# Patient Record
Sex: Female | Born: 1955
Health system: Southern US, Community
[De-identification: ages and names within clinical notes are randomized; demographics above are authoritative.]

## PROBLEM LIST (undated history)

## (undated) DIAGNOSIS — Z9289 Personal history of other medical treatment: Secondary | ICD-10-CM

## (undated) DIAGNOSIS — I499 Cardiac arrhythmia, unspecified: Secondary | ICD-10-CM

## (undated) DIAGNOSIS — E785 Hyperlipidemia, unspecified: Secondary | ICD-10-CM

## (undated) DIAGNOSIS — Z1159 Encounter for screening for other viral diseases: Secondary | ICD-10-CM

## (undated) DIAGNOSIS — F329 Major depressive disorder, single episode, unspecified: Secondary | ICD-10-CM

## (undated) DIAGNOSIS — R42 Dizziness and giddiness: Secondary | ICD-10-CM

## (undated) DIAGNOSIS — R7303 Prediabetes: Secondary | ICD-10-CM

## (undated) DIAGNOSIS — K760 Fatty (change of) liver, not elsewhere classified: Secondary | ICD-10-CM

## (undated) DIAGNOSIS — R011 Cardiac murmur, unspecified: Secondary | ICD-10-CM

## (undated) DIAGNOSIS — J45909 Unspecified asthma, uncomplicated: Secondary | ICD-10-CM

## (undated) DIAGNOSIS — Z9889 Other specified postprocedural states: Secondary | ICD-10-CM

## (undated) DIAGNOSIS — K648 Other hemorrhoids: Secondary | ICD-10-CM

## (undated) DIAGNOSIS — K219 Gastro-esophageal reflux disease without esophagitis: Secondary | ICD-10-CM

## (undated) DIAGNOSIS — B009 Herpesviral infection, unspecified: Secondary | ICD-10-CM

## (undated) DIAGNOSIS — F32A Depression, unspecified: Secondary | ICD-10-CM

## (undated) DIAGNOSIS — E041 Nontoxic single thyroid nodule: Secondary | ICD-10-CM

## (undated) DIAGNOSIS — M1990 Unspecified osteoarthritis, unspecified site: Secondary | ICD-10-CM

## (undated) DIAGNOSIS — K449 Diaphragmatic hernia without obstruction or gangrene: Secondary | ICD-10-CM

## (undated) DIAGNOSIS — R519 Headache, unspecified: Secondary | ICD-10-CM

## (undated) DIAGNOSIS — E559 Vitamin D deficiency, unspecified: Secondary | ICD-10-CM

## (undated) DIAGNOSIS — E876 Hypokalemia: Secondary | ICD-10-CM

## (undated) DIAGNOSIS — I10 Essential (primary) hypertension: Secondary | ICD-10-CM

## (undated) DIAGNOSIS — R51 Headache: Secondary | ICD-10-CM

## (undated) DIAGNOSIS — Z5189 Encounter for other specified aftercare: Secondary | ICD-10-CM

## (undated) DIAGNOSIS — B019 Varicella without complication: Secondary | ICD-10-CM

## (undated) DIAGNOSIS — R112 Nausea with vomiting, unspecified: Secondary | ICD-10-CM

## (undated) DIAGNOSIS — L309 Dermatitis, unspecified: Secondary | ICD-10-CM

## (undated) DIAGNOSIS — T7840XA Allergy, unspecified, initial encounter: Secondary | ICD-10-CM

## (undated) DIAGNOSIS — K5790 Diverticulosis of intestine, part unspecified, without perforation or abscess without bleeding: Secondary | ICD-10-CM

## (undated) DIAGNOSIS — I493 Ventricular premature depolarization: Secondary | ICD-10-CM

## (undated) DIAGNOSIS — C3490 Malignant neoplasm of unspecified part of unspecified bronchus or lung: Secondary | ICD-10-CM

## (undated) DIAGNOSIS — F419 Anxiety disorder, unspecified: Secondary | ICD-10-CM

## (undated) DIAGNOSIS — N809 Endometriosis, unspecified: Secondary | ICD-10-CM

## (undated) HISTORY — DX: Cardiac murmur, unspecified: R01.1

## (undated) HISTORY — PX: TUBAL LIGATION: SHX77

## (undated) HISTORY — DX: Varicella without complication: B01.9

## (undated) HISTORY — DX: Herpesviral infection, unspecified: B00.9

## (undated) HISTORY — PX: RECTAL SURGERY: SHX760

## (undated) HISTORY — DX: Other specified postprocedural states: Z98.890

## (undated) HISTORY — DX: Encounter for screening for other viral diseases: Z11.59

## (undated) HISTORY — DX: Hyperlipidemia, unspecified: E78.5

## (undated) HISTORY — DX: Major depressive disorder, single episode, unspecified: F32.9

## (undated) HISTORY — DX: Unspecified osteoarthritis, unspecified site: M19.90

## (undated) HISTORY — DX: Prediabetes: R73.03

## (undated) HISTORY — DX: Endometriosis, unspecified: N80.9

## (undated) HISTORY — DX: Depression, unspecified: F32.A

## (undated) HISTORY — DX: Dizziness and giddiness: R42

## (undated) HISTORY — DX: Other hemorrhoids: K64.8

## (undated) HISTORY — DX: Allergy, unspecified, initial encounter: T78.40XA

## (undated) HISTORY — PX: BREAST SURGERY: SHX581

## (undated) HISTORY — PX: LASIK: SHX215

## (undated) HISTORY — DX: Hypokalemia: E87.6

## (undated) HISTORY — DX: Fatty (change of) liver, not elsewhere classified: K76.0

## (undated) HISTORY — DX: Diaphragmatic hernia without obstruction or gangrene: K44.9

## (undated) HISTORY — DX: Cardiac arrhythmia, unspecified: I49.9

## (undated) HISTORY — DX: Diverticulosis of intestine, part unspecified, without perforation or abscess without bleeding: K57.90

## (undated) HISTORY — DX: Anxiety disorder, unspecified: F41.9

## (undated) HISTORY — PX: EXPLORATORY LAPAROTOMY: SUR591

## (undated) HISTORY — DX: Personal history of other medical treatment: Z92.89

## (undated) HISTORY — DX: Vitamin D deficiency, unspecified: E55.9

## (undated) HISTORY — DX: Nontoxic single thyroid nodule: E04.1

## (undated) HISTORY — PX: TONSILLECTOMY AND ADENOIDECTOMY: SUR1326

## (undated) HISTORY — DX: Ventricular premature depolarization: I49.3

## (undated) HISTORY — DX: Dermatitis, unspecified: L30.9

## (undated) HISTORY — DX: Encounter for other specified aftercare: Z51.89

## (undated) HISTORY — DX: Gastro-esophageal reflux disease without esophagitis: K21.9

## (undated) HISTORY — DX: Unspecified asthma, uncomplicated: J45.909

---

## 2008-06-09 DIAGNOSIS — K5790 Diverticulosis of intestine, part unspecified, without perforation or abscess without bleeding: Secondary | ICD-10-CM

## 2008-06-09 DIAGNOSIS — K648 Other hemorrhoids: Secondary | ICD-10-CM

## 2008-06-09 DIAGNOSIS — K449 Diaphragmatic hernia without obstruction or gangrene: Secondary | ICD-10-CM

## 2008-06-09 HISTORY — PX: COLONOSCOPY: SHX174

## 2008-06-09 HISTORY — DX: Diverticulosis of intestine, part unspecified, without perforation or abscess without bleeding: K57.90

## 2008-06-09 HISTORY — DX: Diaphragmatic hernia without obstruction or gangrene: K44.9

## 2008-06-09 HISTORY — PX: ESOPHAGOGASTRODUODENOSCOPY: SHX1529

## 2008-06-09 HISTORY — DX: Other hemorrhoids: K64.8

## 2012-06-09 DIAGNOSIS — Z9889 Other specified postprocedural states: Secondary | ICD-10-CM

## 2012-06-09 DIAGNOSIS — I493 Ventricular premature depolarization: Secondary | ICD-10-CM

## 2012-06-09 HISTORY — DX: Ventricular premature depolarization: I49.3

## 2012-06-09 HISTORY — DX: Other specified postprocedural states: Z98.890

## 2013-01-11 ENCOUNTER — Other Ambulatory Visit (HOSPITAL_COMMUNITY)
Admission: RE | Admit: 2013-01-11 | Discharge: 2013-01-11 | Disposition: A | Discharge status: Home / Self Care | Payer: BC Managed Care – PPO | Source: Ambulatory Visit | Attending: Interventional Radiology | Admitting: Interventional Radiology

## 2013-01-11 ENCOUNTER — Ambulatory Visit
Admission: RE | Admit: 2013-01-11 | Discharge: 2013-01-11 | Disposition: A | Discharge status: Home / Self Care | Payer: BC Managed Care – PPO | Source: Ambulatory Visit | Attending: Endocrinology | Admitting: Endocrinology

## 2013-01-11 ENCOUNTER — Other Ambulatory Visit: Payer: Self-pay | Admitting: Endocrinology

## 2013-01-11 DIAGNOSIS — E041 Nontoxic single thyroid nodule: Secondary | ICD-10-CM

## 2013-01-11 HISTORY — PX: OTHER SURGICAL HISTORY: SHX169

## 2013-01-12 ENCOUNTER — Other Ambulatory Visit: Payer: Self-pay

## 2013-04-09 DIAGNOSIS — Z9289 Personal history of other medical treatment: Secondary | ICD-10-CM

## 2013-04-09 HISTORY — DX: Personal history of other medical treatment: Z92.89

## 2014-06-09 DIAGNOSIS — Z9289 Personal history of other medical treatment: Secondary | ICD-10-CM

## 2014-06-09 HISTORY — DX: Personal history of other medical treatment: Z92.89

## 2014-07-10 HISTORY — PX: RECTOCELE REPAIR: SHX761

## 2015-08-17 ENCOUNTER — Other Ambulatory Visit: Payer: Self-pay | Admitting: Family Medicine

## 2015-08-17 DIAGNOSIS — E041 Nontoxic single thyroid nodule: Secondary | ICD-10-CM

## 2015-08-21 ENCOUNTER — Ambulatory Visit
Admission: RE | Admit: 2015-08-21 | Discharge: 2015-08-21 | Disposition: A | Discharge status: Home / Self Care | Payer: BLUE CROSS/BLUE SHIELD | Source: Ambulatory Visit | Attending: Family Medicine | Admitting: Family Medicine

## 2015-08-21 DIAGNOSIS — E041 Nontoxic single thyroid nodule: Secondary | ICD-10-CM

## 2015-10-08 DIAGNOSIS — Z136 Encounter for screening for cardiovascular disorders: Secondary | ICD-10-CM | POA: Diagnosis not present

## 2015-11-08 DIAGNOSIS — Z1231 Encounter for screening mammogram for malignant neoplasm of breast: Secondary | ICD-10-CM | POA: Diagnosis not present

## 2015-11-15 DIAGNOSIS — I10 Essential (primary) hypertension: Secondary | ICD-10-CM | POA: Diagnosis not present

## 2015-11-15 DIAGNOSIS — M179 Osteoarthritis of knee, unspecified: Secondary | ICD-10-CM | POA: Diagnosis not present

## 2015-11-22 ENCOUNTER — Ambulatory Visit (INDEPENDENT_AMBULATORY_CARE_PROVIDER_SITE_OTHER): Payer: BLUE CROSS/BLUE SHIELD | Admitting: Family Medicine

## 2015-11-22 ENCOUNTER — Encounter: Payer: Self-pay | Admitting: Family Medicine

## 2015-11-22 VITALS — BP 123/76 | HR 50 | Temp 97.9°F | Resp 20 | Ht 63.0 in | Wt 169.8 lb

## 2015-11-22 DIAGNOSIS — I2510 Atherosclerotic heart disease of native coronary artery without angina pectoris: Secondary | ICD-10-CM | POA: Insufficient documentation

## 2015-11-22 DIAGNOSIS — R918 Other nonspecific abnormal finding of lung field: Secondary | ICD-10-CM | POA: Insufficient documentation

## 2015-11-22 DIAGNOSIS — I10 Essential (primary) hypertension: Secondary | ICD-10-CM | POA: Insufficient documentation

## 2015-11-22 DIAGNOSIS — Z7689 Persons encountering health services in other specified circumstances: Secondary | ICD-10-CM

## 2015-11-22 DIAGNOSIS — K760 Fatty (change of) liver, not elsewhere classified: Secondary | ICD-10-CM | POA: Insufficient documentation

## 2015-11-22 DIAGNOSIS — E041 Nontoxic single thyroid nodule: Secondary | ICD-10-CM | POA: Insufficient documentation

## 2015-11-22 DIAGNOSIS — Z6830 Body mass index (BMI) 30.0-30.9, adult: Secondary | ICD-10-CM | POA: Diagnosis not present

## 2015-11-22 DIAGNOSIS — F418 Other specified anxiety disorders: Secondary | ICD-10-CM | POA: Insufficient documentation

## 2015-11-22 DIAGNOSIS — Z7189 Other specified counseling: Secondary | ICD-10-CM

## 2015-11-22 DIAGNOSIS — M47814 Spondylosis without myelopathy or radiculopathy, thoracic region: Secondary | ICD-10-CM | POA: Insufficient documentation

## 2015-11-22 NOTE — Patient Instructions (Signed)
Regina Black, cardiologist, any images/labs.  appt 3-4 weeks for CPE after all records received.   It was a pleasure meeting you today! I look forward to taking care of you .

## 2015-11-22 NOTE — Progress Notes (Signed)
Patient ID: Regina Black, female   DOB: 01/02/1956, 60 y.o.   MRN: [id number]      Patient ID: Regina Black, female  DOB: 10-Aug-1955, 60 y.o.   MRN: [id number]  Subjective:  Regina Black is a 60 y.o.  female present for establishment of care.  All past medical history, surgical history, allergies, family history, immunizations, medications and social history were obtained and entered  in the electronic medical record today. All recent labs, ED visits and hospitalizations within the last year were reviewed. Pt did not bring records, as requested, care everywhere was used to attempt to find records for patient.  Prior PCP: Dr. Gwenyth Allegra, [facility]. ([facility]) GYN: Dr. Kris Mouton. Per his note 08/21/2015: HTN, thyroid disease, hyperlipidemia, prediabetes.  Patient presents today for establishment of care with multiple things she states needs followed up on that were abnormal (Labs/imagining). No records are available for any these concerns and she did not bring records with her.   She states she has a heart murmur and arrhythmia. She also has hypertension that had been difficult to control. She is prescribed HCTZ and lisinopril and metoprolol. She has not been seen by her "PCP" or cardiologist in over 2 years. She reports having completed a stress test and echocardiogram, no records available and she does not recall the cardiologists name. 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   She also states she had elevated cholesterol, liver enzymes and glucose on her labs through work, again no records available. She is not on mediations for any of the above. She reports the PA completed "hepatitis C" labs, to work up LFT abnormality and it was normal.   Patient reports history of a lung nodule on CT last year. She is a former smoker. [place] Patient states the CT report states she needs a follow up CT in 6-12 months and that is overdue.   Pt reports she has a thyroid nodule. She thinks it is getting bigger, but according to her, her last scan states it is smaller. She reports she use to have an endocrinologist a long time ago.   Depression/anxiety: pt reports she has taken Cymbalta 30 mg daily for over 10 years and is doing well, no concerns.   Health maintenance:  Colonoscopy: pt reports it was completed about 5 years ago with an EGD. She does not recall where. She states it was normal.  Mammogram: Dr. Kris Mouton [facility] (2017) Cervical cancer screening: Dr. Kris Mouton [facility] (2015) Immunizations: UTD flu shot 2016 through employment. Unknown other vaccinations.  Infectious disease screening: Unknown. Possibly had hep c screen through work.  DEXA: unknown  Past Medical History  Diagnosis Date  . Allergy   . Anxiety   . Asthma   . Blood transfusion without reported diagnosis   . Chicken pox   . Depression   . GERD (gastroesophageal reflux disease)   . Heart murmur    Allergies  Allergen Reactions  . Butorphanol Other (See Comments)    syncope  . Codeine Nausea And Vomiting   Past Surgical History  Procedure Laterality Date  . Breast surgery      augmentation  . Rectal surgery    . Tonsillectomy and adenoidectomy    . Rectocele repair  07/2014   Family History  Problem Relation Age of Onset  . Lung cancer Mother   .  Heart disease Mother   . Alcohol abuse Father   . Heart disease Father   . Breast cancer Paternal Aunt 2  . Heart disease Maternal Grandmother   . Arthritis Maternal Grandmother   . Arthritis Maternal Grandfather   . Heart disease Maternal Grandfather   . Hearing loss Maternal Grandfather   . Stroke Maternal Grandfather   . Diabetes Paternal Grandmother   . Hearing loss Paternal Grandfather    Social History   Social History  . Marital Status: Married    Spouse Name: N/A  . Number of  Children: N/A  . Years of Education: N/A   Occupational History  . Not on file.   Social History Main Topics  . Smoking status: Former Research scientist (life sciences)  . Smokeless tobacco: Never Used  . Alcohol Use: 1.2 oz/week    2 Glasses of wine, 0 Standard drinks or equivalent per week  . Drug Use: No  . Sexual Activity: Yes    Birth Control/ Protection: None   Other Topics Concern  . Not on file   Social History Narrative   Married, Shanon Brow. No children.   Mini dachshund (saddie-mae)   HS diploma- Finance.    Former smoker, no etoh or drugs.   Drinks caffeine   Wears seatbelt   Exercises routinely   Smoke detector in the home.    Forearms in the home, locked.    Feels safe in her relationships.      Medication List       This list is accurate as of: 11/22/15 11:31 AM.  Always use your most recent med list.               DULoxetine 30 MG capsule  Commonly known as:  CYMBALTA  Take 30 mg by mouth.     hydrochlorothiazide 25 MG tablet  Commonly known as:  HYDRODIURIL  Take 25 mg by mouth daily.     ibuprofen 800 MG tablet  Commonly known as:  ADVIL,MOTRIN  Take 800 mg by mouth.     lisinopril 40 MG tablet  Commonly known as:  PRINIVIL,ZESTRIL  Take 40 mg by mouth daily.     metoprolol 100 MG tablet  Commonly known as:  LOPRESSOR  Take 100 mg by mouth daily.     omeprazole 20 MG capsule  Commonly known as:  PRILOSEC  Take 20 mg by mouth.     penciclovir 1 % cream  Commonly known as:  DENAVIR  Apply topically.     valACYclovir 500 MG tablet  Commonly known as:  VALTREX  Take 500 mg by mouth.        ROS: 14 pt review of systems performed and negative (unless mentioned in an HPI)  Objective: BP 123/76 mmHg  Pulse 50  Temp(Src) 97.9 F (36.6 C) (Oral)  Resp 20  Ht '5\' 3"'$  (1.6 m)  Wt 169 lb 12.8 oz (77.021 kg)  BMI 30.09 kg/m2  SpO2 98% Gen: Afebrile. No acute distress. Nontoxic in appearance, well-developed, well-nourished,caucasian  female.  HENT: AT. [place]. MMM,  no oral lesions Eyes:Pupils Equal Round Reactive to light, Extraocular movements intact,  Conjunctiva without redness, discharge or icterus. Neck/lymp/endocrine: Supple, no  lymphadenopathy, mild (felt R>L) thyromegaly. Large oval area of hypopigmentation anterior neck above cricoid.  CV: RRR, no edema, +2/4 P posterior tibialis pulses. Chest: CTAB, no wheeze, rhonchi or crackles.  Abd: Soft. NTND. BS present  Skin: Warm and well-perfused. Skin intact. Neuro/Msk: Normal gait. PERLA. EOMi. Alert. Oriented x3.  Psych: Normal affect, dress and demeanor. Normal speech. Normal thought content and judgment.  Assessment/plan: Nemiah Bubar is a 60 y.o. female present for establishment of care.  Essential hypertension, benign - confusing h/o on whom is actually managing her cardiac conditions, and what they even are.  - requested records from prior cardiologist. I want to see copies of the cardiac work up she had and actual true diagnoses prior to adding them to her problem list.  - Currently her HTN is controlled on BB, Acei and HCTZ. Will continue - Discussed with pt if she is listing me as her PCP, unless she is seeing cardiology, I will be the one managing her medical issues and all medications. She can continue to ger her labs through employer yearly and have those forwarded to [place], but she will have to have CPE yearly and chronic medical issues followed here.  Depression with anxiety - continue Cymbalta, uncertain if every tried off med or others medications.  - By care everywhere it appears Dr. Kathlen Mody prescribed this medication only.   BMI 30.0-30.9,adult Diet and exercise counseled. Will provide additional resources after CPE/labs.    Return in about 4 weeks (around 12/20/2015) for CPE.  Greater than 45 minutes was spent with patient, greater than 50% of that time was spent face-to-face with patient counseling and coordinating care.  Electronically signed by: Howard Pouch, DO [place]

## 2015-12-12 DIAGNOSIS — R42 Dizziness and giddiness: Secondary | ICD-10-CM | POA: Diagnosis not present

## 2015-12-20 ENCOUNTER — Encounter: Payer: Self-pay | Admitting: Family Medicine

## 2015-12-20 ENCOUNTER — Ambulatory Visit (INDEPENDENT_AMBULATORY_CARE_PROVIDER_SITE_OTHER): Payer: BLUE CROSS/BLUE SHIELD | Admitting: Family Medicine

## 2015-12-20 VITALS — BP 140/78 | HR 50 | Temp 98.5°F | Resp 20 | Ht 63.0 in | Wt 169.8 lb

## 2015-12-20 DIAGNOSIS — Z13220 Encounter for screening for lipoid disorders: Secondary | ICD-10-CM

## 2015-12-20 DIAGNOSIS — E041 Nontoxic single thyroid nodule: Secondary | ICD-10-CM

## 2015-12-20 DIAGNOSIS — Z0000 Encounter for general adult medical examination without abnormal findings: Secondary | ICD-10-CM

## 2015-12-20 DIAGNOSIS — Z1321 Encounter for screening for nutritional disorder: Secondary | ICD-10-CM

## 2015-12-20 DIAGNOSIS — F418 Other specified anxiety disorders: Secondary | ICD-10-CM

## 2015-12-20 DIAGNOSIS — Z114 Encounter for screening for human immunodeficiency virus [HIV]: Secondary | ICD-10-CM | POA: Diagnosis not present

## 2015-12-20 DIAGNOSIS — Z6830 Body mass index (BMI) 30.0-30.9, adult: Secondary | ICD-10-CM

## 2015-12-20 DIAGNOSIS — Z131 Encounter for screening for diabetes mellitus: Secondary | ICD-10-CM | POA: Diagnosis not present

## 2015-12-20 DIAGNOSIS — Z130 Encounter for screening for diseases of the blood and blood-forming organs and certain disorders involving the immune mechanism: Secondary | ICD-10-CM | POA: Diagnosis not present

## 2015-12-20 DIAGNOSIS — I10 Essential (primary) hypertension: Secondary | ICD-10-CM

## 2015-12-20 DIAGNOSIS — R918 Other nonspecific abnormal finding of lung field: Secondary | ICD-10-CM

## 2015-12-20 DIAGNOSIS — K219 Gastro-esophageal reflux disease without esophagitis: Secondary | ICD-10-CM

## 2015-12-20 DIAGNOSIS — Z23 Encounter for immunization: Secondary | ICD-10-CM | POA: Diagnosis not present

## 2015-12-20 DIAGNOSIS — R945 Abnormal results of liver function studies: Secondary | ICD-10-CM

## 2015-12-20 DIAGNOSIS — I499 Cardiac arrhythmia, unspecified: Secondary | ICD-10-CM

## 2015-12-20 DIAGNOSIS — Z1329 Encounter for screening for other suspected endocrine disorder: Secondary | ICD-10-CM | POA: Diagnosis not present

## 2015-12-20 DIAGNOSIS — R7989 Other specified abnormal findings of blood chemistry: Secondary | ICD-10-CM

## 2015-12-20 LAB — CBC WITH DIFFERENTIAL/PLATELET
BASOS ABS: 0.2 10*3/uL — AB (ref 0.0–0.1)
Basophils Relative: 2.4 % (ref 0.0–3.0)
Eosinophils Absolute: 0.8 10*3/uL — ABNORMAL HIGH (ref 0.0–0.7)
Eosinophils Relative: 10.4 % — ABNORMAL HIGH (ref 0.0–5.0)
HEMATOCRIT: 40.3 % (ref 36.0–46.0)
Hemoglobin: 13.5 g/dL (ref 12.0–15.0)
LYMPHS ABS: 1.8 10*3/uL (ref 0.7–4.0)
Lymphocytes Relative: 24.7 % (ref 12.0–46.0)
MCHC: 33.4 g/dL (ref 30.0–36.0)
MCV: 88.8 fl (ref 78.0–100.0)
MONOS PCT: 4.6 % (ref 3.0–12.0)
Monocytes Absolute: 0.3 10*3/uL (ref 0.1–1.0)
Neutro Abs: 4.2 10*3/uL (ref 1.4–7.7)
Neutrophils Relative %: 57.9 % (ref 43.0–77.0)
PLATELETS: 339 10*3/uL (ref 150.0–400.0)
RBC: 4.55 Mil/uL (ref 3.87–5.11)
RDW: 14.3 % (ref 11.5–15.5)
WBC: 7.3 10*3/uL (ref 4.0–10.5)

## 2015-12-20 LAB — TSH: TSH: 0.43 u[IU]/mL (ref 0.35–4.50)

## 2015-12-20 LAB — VITAMIN D 25 HYDROXY (VIT D DEFICIENCY, FRACTURES): VITD: 28.06 ng/mL — AB (ref 30.00–100.00)

## 2015-12-20 LAB — HIV ANTIBODY (ROUTINE TESTING W REFLEX): HIV: NONREACTIVE

## 2015-12-20 LAB — HEMOGLOBIN A1C: Hgb A1c MFr Bld: 5.5 % (ref 4.6–6.5)

## 2015-12-20 MED ORDER — DULOXETINE HCL 60 MG PO CPEP
60.0000 mg | ORAL_CAPSULE | Freq: Every day | ORAL | Status: DC
Start: 2015-12-20 — End: 2016-03-30

## 2015-12-20 NOTE — Progress Notes (Signed)
Patient ID: Regina Black, female   DOB: 06-05-1956, 60 y.o.   MRN: [id number]     Patient ID: Regina Black, female  DOB: January 10, 1956, 60 y.o.   MRN: [id number] Patient Care Team    Relationship Specialty Notifications Start End  [name], DO PCP - General Family Medicine  11/22/15   Revonda Standard. Rohrbeck, MD  Cardiology  11/22/15   Luellen Pucker, MD  Obstetrics and Gynecology  12/10/15     Subjective:  Regina Black is a 60 y.o.  Female  present for CPE without PAP. All past medical history, surgical history, allergies, family history, immunizations, medications and social history were updated in the electronic medical record today. All recent labs, ED visits and hospitalizations within the last year were reviewed. Prior PCP: Dr. Gwenyth Allegra, [facility]. ([facility]) GYN: Dr. Kris Mouton. Per his note 08/21/2015: HTN, thyroid disease, hyperlipidemia, prediabetes.  Patient presents today for establishment of care with multiple things she states needs followed up on that were abnormal (Labs/imagining). No records are available for any these concerns and she did not bring records with her.   lung nodule: lung nodule on  CT 09/04/2014. Ground glass appearance mostly RUL largest 13 mm, several small pulmonary nodules largest RUL 57m. It was recommended by radiology f/u in 3 months. NO repeat CT scan has been completed. She is a former smoker, about a 10 pack year history.   Depression/anxiety: pt reports she has taken Cymbalta 30 mg daily for over 10 years. However she feels more stress and it is not controlled. 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 Her husband has been ill and in the hospital. She is the "go to person" in her family and feels overwhelmed. They are having issues with her nephew, which she states she is like a mother to. She is tearful, and feels she may need more coverage.    Health maintenance:  Colonoscopy: pt reports it was  completed about 5 years ago with an EGD/colonoscopy. She does not recall where. She states it was normal. 10 year follow up recommended--> 2020? Repeat. Records requested. Mammogram: Dr. FKris MoutonGYN (11/09/2015). [facility]1 Cervical cancer screening: Dr. FKris MoutonGYN (05/19/2014) Immunizations: UTD flu shot 2016 through employment. tdap administered today Infectious disease screening: hep c negative 08/2015  DEXA: Completed 07/26/15, pt reports it was normal. [facility]. Records requested. Assistive device: none Oxygen use: none Patient has a Dental and Orthodontic home and routine visits.  Hospitalizations/ED visits: none  Immunization History  Administered Date(s) Administered  . Influenza-Unspecified 03/23/2015  . Tdap 12/20/2015     Past Medical History  Diagnosis Date  . Allergy   . Anxiety   . Asthma   . Blood transfusion without reported diagnosis   . Chicken pox   . Depression   . GERD (gastroesophageal reflux disease)   . Heart murmur   . Vitamin D deficiency   . Prediabetes   . Hyperlipidemia   . Thyroid nodule   . History of cardiovascular stress test 04/2013    negative per OV note from Dr. RMarijo File . History of echocardiogram 04/2013    normal study; EF 75% per Dr. RMarijo File . PVC (premature ventricular contraction) 2014    palpitations  . History of EKG 2016    NSR with PVC, Dr. RMarijo File . Hypokalemia   . History of Holter monitoring 2014    palpitations/pvc  . Fatty liver   . PCR  DNA positive for HSV1   . Osteoarthritis   . Eczema   . Vertigo   . Arrhythmia   . Prediabetes   . Endometriosis    Allergies  Allergen Reactions  . Butorphanol Other (See Comments)    syncope  . Codeine Nausea And Vomiting   Past Surgical History  Procedure Laterality Date  . Breast surgery      reduction  . Rectal surgery    . Tonsillectomy and adenoidectomy    . Rectocele repair  07/2014  . Tubal ligation    . Thyroid nodule asp  01/11/2013  . Exploratory  laparotomy      endometriosis   Family History  Problem Relation Age of Onset  . Lung cancer Mother   . Heart disease Mother   . Alcohol abuse Father   . Heart disease Father   . Breast cancer Paternal Aunt 4  . Heart disease Maternal Grandmother   . Arthritis Maternal Grandmother   . Arthritis Maternal Grandfather   . Heart disease Maternal Grandfather   . Hearing loss Maternal Grandfather   . Stroke Maternal Grandfather   . Diabetes Paternal Grandmother   . Hearing loss Paternal Grandfather    Social History   Social History  . Marital Status: Married    Spouse Name: N/A  . Number of Children: N/A  . Years of Education: N/A   Occupational History  . Not on file.   Social History Main Topics  . Smoking status: Former Research scientist (life sciences)  . Smokeless tobacco: Never Used  . Alcohol Use: 1.2 oz/week    2 Glasses of wine, 0 Standard drinks or equivalent per week  . Drug Use: No  . Sexual Activity: Yes    Birth Control/ Protection: None   Other Topics Concern  . Not on file   Social History Narrative   Married, Shanon Brow. No children.   Mini dachshund (saddie-mae)   HS diploma- Finance.    Former smoker, no etoh or drugs.   Drinks caffeine   Wears seatbelt   Exercises routinely   Smoke detector in the home.    Forearms in the home, locked.    Feels safe in her relationships.      Medication List       This list is accurate as of: 12/20/15 11:59 PM.  Always use your most recent med list.               DULoxetine 60 MG capsule  Commonly known as:  CYMBALTA  Take 1 capsule (60 mg total) by mouth daily.     hydrochlorothiazide 25 MG tablet  Commonly known as:  HYDRODIURIL  Take 25 mg by mouth daily.     ibuprofen 800 MG tablet  Commonly known as:  ADVIL,MOTRIN  Take 800 mg by mouth.     lisinopril 40 MG tablet  Commonly known as:  PRINIVIL,ZESTRIL  Take 40 mg by mouth daily.     metoprolol succinate 100 MG 24 hr tablet  Commonly known as:  TOPROL-XL  Take 100  mg by mouth daily. Take with or immediately following a meal.     omeprazole 20 MG capsule  Commonly known as:  PRILOSEC  Take 20 mg by mouth.     penciclovir 1 % cream  Commonly known as:  DENAVIR  Apply topically.     valACYclovir 500 MG tablet  Commonly known as:  VALTREX  Take 500 mg by mouth.         Recent  Results (from the past 2160 hour(s))  CBC w/Diff     Status: Abnormal   Collection Time: 12/20/15  8:33 AM  Result Value Ref Range   WBC 7.3 4.0 - 10.5 K/uL   RBC 4.55 3.87 - 5.11 Mil/uL   Hemoglobin 13.5 12.0 - 15.0 g/dL   HCT 40.3 36.0 - 46.0 %   MCV 88.8 78.0 - 100.0 fl   MCHC 33.4 30.0 - 36.0 g/dL   RDW 14.3 11.5 - 15.5 %   Platelets 339.0 150.0 - 400.0 K/uL   Neutrophils Relative % 57.9 43.0 - 77.0 %   Lymphocytes Relative 24.7 12.0 - 46.0 %   Monocytes Relative 4.6 3.0 - 12.0 %   Eosinophils Relative 10.4 (H) 0.0 - 5.0 %   Basophils Relative 2.4 0.0 - 3.0 %   Neutro Abs 4.2 1.4 - 7.7 K/uL   Lymphs Abs 1.8 0.7 - 4.0 K/uL   Monocytes Absolute 0.3 0.1 - 1.0 K/uL   Eosinophils Absolute 0.8 (H) 0.0 - 0.7 K/uL   Basophils Absolute 0.2 (H) 0.0 - 0.1 K/uL  Comp Met (CMET)     Status: Abnormal   Collection Time: 12/20/15  8:33 AM  Result Value Ref Range   Sodium 142 135 - 145 mEq/L   Potassium 4.5 3.5 - 5.1 mEq/L   Chloride 104 96 - 112 mEq/L   CO2 27 19 - 32 mEq/L   Glucose, Bld 108 (H) 70 - 99 mg/dL   BUN 20 6 - 23 mg/dL   Creatinine, Ser 0.87 0.40 - 1.20 mg/dL   Total Bilirubin 0.3 0.2 - 1.2 mg/dL   Alkaline Phosphatase 137 (H) 39 - 117 U/L   AST 26 0 - 37 U/L   ALT 33 0 - 35 U/L   Total Protein 7.2 6.0 - 8.3 g/dL   Albumin 4.0 3.5 - 5.2 g/dL   Calcium 9.8 8.4 - 10.5 mg/dL   GFR 71.08 >60.00 mL/min  TSH     Status: None   Collection Time: 12/20/15  8:33 AM  Result Value Ref Range   TSH 0.43 0.35 - 4.50 uIU/mL  Vitamin D (25 hydroxy)     Status: Abnormal   Collection Time: 12/20/15  8:33 AM  Result Value Ref Range   VITD 28.06 (L) 30.00 - 100.00  ng/mL  HgB A1c     Status: None   Collection Time: 12/20/15  8:33 AM  Result Value Ref Range   Hgb A1c MFr Bld 5.5 4.6 - 6.5 %    Comment: Glycemic Control Guidelines for People with Diabetes:Non Diabetic:  <6%Goal of Therapy: <7%Additional Action Suggested:  >8%   HIV antibody (with reflex)     Status: None   Collection Time: 12/20/15  8:48 AM  Result Value Ref Range   HIV 1&2 Ab, 4th Generation NONREACTIVE NONREACTIVE    Comment:   HIV-1 antigen and HIV-1/HIV-2 antibodies were not detected.  There is no laboratory evidence of HIV infection.   HIV-1/2 Antibody Diff        Not indicated. HIV-1 RNA, Qual TMA          Not indicated.     PLEASE NOTE: This information has been disclosed to you from records whose confidentiality may be protected by state law. If your state requires such protection, then the state law prohibits you from making any further disclosure of the information without the specific written consent of the person to whom it pertains, or as otherwise permitted by law. A general  authorization for the release of medical or other information is NOT sufficient for this purpose.   The performance of this assay has not been clinically validated in patients less than 87 years old.   For additional information please refer to http://education.questdiagnostics.com/faq/FAQ106.  (This link is being provided for informational/educational purposes only.)       US Thyroid  08/21/2015  CLINICAL DATA:  60 year old female with a history of left thyroid nodule, previously biopsied. EXAM: THYROID ULTRASOUND TECHNIQUE: Ultrasound examination of the thyroid gland and adjacent soft tissues was performed. COMPARISON:  03/01/2014, 01/11/2013 FINDINGS: Right thyroid lobe Measurements: 4.1 cm x 1.3 cm x 1.3 cm. Relatively homogeneous appearance of the right thyroid. Left thyroid lobe Measurements: 4.2 cm x 1.5 cm x 2.3 cm. Homogeneous appearance of the left thyroid. Similar appearance of  solid nodule with coarsened internal calcification. Nodule measures 3.2 cm x 1.4 cm x 1.8 cm. This again measures smaller than the study dated 03/01/2014. Isthmus Thickness: 2 mm.  No nodules visualized. Lymphadenopathy None visualized. IMPRESSION: Continued decreased size of previously biopsied left-sided thyroid nodule, with no new nodules. Signed, Dulcy Fanny. Earleen Newport, DO [facility] Electronically Signed   By: Corrie Mckusick D.O.   On: 08/21/2015 17:23     ROS: 14 pt review of systems performed and negative (unless mentioned in an HPI)  Objective: BP 140/78 mmHg  Pulse 50  Temp(Src) 98.5 F (36.9 C)  Resp 20  Ht 5' 3"  (1.6 m)  Wt 169 lb 12.8 oz (77.021 kg)  BMI 30.09 kg/m2  SpO2 98% Gen: Afebrile. No acute distress. Nontoxic in appearance, well-developed, well-nourished,  Very pleasant caucasian female.  HENT: AT. [place]. Bilateral TM visualized and normal in appearance, normal external auditory canal. MMM, no oral lesions, adequate dentition. Bilateral nares within normal limits. Throat without erythema, ulcerations or exudates. no Cough on exam, no hoarseness on exam. Eyes:Pupils Equal Round Reactive to light, Extraocular movements intact,  Conjunctiva without redness, discharge or icterus. Neck/lymp/endocrine: Supple,no lymphadenopathy, no thyromegaly CV: RRR 1/6 SM, no edema, +2/4 P posterior tibialis pulses. no carotid bruits. No JVD. Chest: CTAB, no wheeze, rhonchi or crackles. normal Respiratory effort. good Air movement. Abd: Soft. round. NTND. BS present. no Masses palpated.. No rebound tenderness or guarding. Liver edge palpable.  Skin: no rashes, purpura or petechiae. Warm and well-perfused. Skin intact. Neuro/Msk:  Normal gait. PERLA. EOMi. Alert. Oriented x3.  Cranial nerves II through XII intact. Muscle strength 5/5 upper/lower extremity. DTRs equal bilaterally. Psych: tearful. Sad.  Normal dress. Normal speech. Normal  thought content and judgment.  Assessment/plan: Regina Black is a 60 y.o. female present for CPE without PAP. Encounter for preventive health examination/BMI 30.0-30.9,adult: Patient was encouraged to exercise greater than 150 minutes a week. Patient was encouraged to choose a diet filled with fresh fruits and vegetables, and lean meats. AVS provided to patient today for education/recommendation on gender specific health and safety maintenance. - CBC w/Diff - TSH - Vitamin D (25 hydroxy) - HIV antibody (with reflex) - HgB A1c--> pt was a prediabetic in 2015 and placed on metformin.   Need for diphtheria-tetanus-pertussis (Tdap) vaccine, adult/adolescent - Tdap vaccine greater than or equal to 7yo IM  Gastroesophageal reflux disease, esophagitis presence not specified - continue Protonix, will eventually want to try and taper off medicine  Multiple pulmonary nodules - report scanned from [facility]. Specifics above in note for chest. - Elevated lft with elevated alk phos/GTT. No complete liver of imaging completed.  -  Repeat CT overdue.  - CT Chest will be ordered.  Thyroid nodule - appears to be shrinking, repeat US 09/2016 indictaed  Essential hypertension, benign/irregular heart beat Stable.  Continue current regimen for now.  - Consider lowering to 75 mg daily. HR 50 - uncertain arrhythmia history. Prior PCP Records are not revealing as type, metoprolol was prescribed and pt feels HR controlled. No palpitations. PVC documented in prior PCP records. - She states she will be due in October for follow up cardiology appt. No records from a cardiologist has been received and she is requesting new cardiology. - will need to see pt back and perform EKG and consider lower metoprolol to 75 mg. I would like to wait for additional records before referral, since I have no specifics on her condition.   Depression with anxiety - increase Cymbalta to 60 mg QD today - pt to follow up 4 weeks.    Elevated LFTs - Alk phos elevated, GTT elevated in the past, elevated ALT history for about 2 years intermittently from old records.  - Hepatitis B,C have been negative. - HIV completed today and negative - No imaging of the Liver has been completed that pt is aware.  - Will need to order additional labs for eval of liver and consider imaging.     Return in about 4 weeks (around 01/17/2016) for depression/anxiety.  Electronically signed by: Howard Pouch, DO [place]

## 2015-12-20 NOTE — Patient Instructions (Addendum)
Health Maintenance, Female Adopting a healthy lifestyle and getting preventive care can go a long way to promote health and wellness. Talk with your health care provider about what schedule of regular examinations is right for you. This is a good chance for you to check in with your provider about disease prevention and staying healthy. In between checkups, there are plenty of things you can do on your own. Experts have done a lot of research about which lifestyle changes and preventive measures are most likely to keep you healthy. Ask your health care provider for more information. WEIGHT AND DIET  Eat a healthy diet  Be sure to include plenty of vegetables, fruits, low-fat dairy products, and lean protein.  Do not eat a lot of foods high in solid fats, added sugars, or salt.  Get regular exercise. This is one of the most important things you can do for your health.  Most adults should exercise for at least 150 minutes each week. The exercise should increase your heart rate and make you sweat (moderate-intensity exercise).  Most adults should also do strengthening exercises at least twice a week. This is in addition to the moderate-intensity exercise.  Maintain a healthy weight  Body mass index (BMI) is a measurement that can be used to identify possible weight problems. It estimates body fat based on height and weight. Your health care provider can help determine your BMI and help you achieve or maintain a healthy weight.  For females 20 years of age and older:   A BMI below 18.5 is considered underweight.  A BMI of 18.5 to 24.9 is normal.  A BMI of 25 to 29.9 is considered overweight.  A BMI of 30 and above is considered obese.  Watch levels of cholesterol and blood lipids  You should start having your blood tested for lipids and cholesterol at 58 years of age, then have this test every 5 years.  You may need to have your cholesterol levels checked more often if:  Your lipid  or cholesterol levels are high.  You are older than 58 years of age.  You are at high risk for heart disease.  CANCER SCREENING   Lung Cancer  Lung cancer screening is recommended for adults 55-80 years old who are at high risk for lung cancer because of a history of smoking.  A yearly low-dose CT scan of the lungs is recommended for people who:  Currently smoke.  Have quit within the past 15 years.  Have at least a 30-pack-year history of smoking. A pack year is smoking an average of one pack of cigarettes a day for 1 year.  Yearly screening should continue until it has been 15 years since you quit.  Yearly screening should stop if you develop a health problem that would prevent you from having lung cancer treatment.  Breast Cancer  Practice breast self-awareness. This means understanding how your breasts normally appear and feel.  It also means doing regular breast self-exams. Let your health care provider know about any changes, no matter how small.  If you are in your 20s or 30s, you should have a clinical breast exam (CBE) by a health care provider every 1-3 years as part of a regular health exam.  If you are 40 or older, have a CBE every year. Also consider having a breast X-ray (mammogram) every year.  If you have a family history of breast cancer, talk to your health care provider about genetic screening.  If you   are at high risk for breast cancer, talk to your health care provider about having an MRI and a mammogram every year.  Breast cancer gene (BRCA) assessment is recommended for women who have family members with BRCA-related cancers. BRCA-related cancers include:  Breast.  Ovarian.  Tubal.  Peritoneal cancers.  Results of the assessment will determine the need for genetic counseling and BRCA1 and BRCA2 testing. Cervical Cancer Your health care provider may recommend that you be screened regularly for cancer of the pelvic organs (ovaries, uterus, and  vagina). This screening involves a pelvic examination, including checking for microscopic changes to the surface of your cervix (Pap test). You may be encouraged to have this screening done every 3 years, beginning at age 21.  For women ages 30-65, health care providers may recommend pelvic exams and Pap testing every 3 years, or they may recommend the Pap and pelvic exam, combined with testing for human papilloma virus (HPV), every 5 years. Some types of HPV increase your risk of cervical cancer. Testing for HPV may also be done on women of any age with unclear Pap test results.  Other health care providers may not recommend any screening for nonpregnant women who are considered low risk for pelvic cancer and who do not have symptoms. Ask your health care provider if a screening pelvic exam is right for you.  If you have had past treatment for cervical cancer or a condition that could lead to cancer, you need Pap tests and screening for cancer for at least 20 years after your treatment. If Pap tests have been discontinued, your risk factors (such as having a new sexual partner) need to be reassessed to determine if screening should resume. Some women have medical problems that increase the chance of getting cervical cancer. In these cases, your health care provider may recommend more frequent screening and Pap tests. Colorectal Cancer  This type of cancer can be detected and often prevented.  Routine colorectal cancer screening usually begins at 58 years of age and continues through 58 years of age.  Your health care provider may recommend screening at an earlier age if you have risk factors for colon cancer.  Your health care provider may also recommend using home test kits to check for hidden blood in the stool.  A small camera at the end of a tube can be used to examine your colon directly (sigmoidoscopy or colonoscopy). This is done to check for the earliest forms of colorectal  cancer.  Routine screening usually begins at age 50.  Direct examination of the colon should be repeated every 5-10 years through 58 years of age. However, you may need to be screened more often if early forms of precancerous polyps or small growths are found. Skin Cancer  Check your skin from head to toe regularly.  Tell your health care provider about any new moles or changes in moles, especially if there is a change in a mole's shape or color.  Also tell your health care provider if you have a mole that is larger than the size of a pencil eraser.  Always use sunscreen. Apply sunscreen liberally and repeatedly throughout the day.  Protect yourself by wearing long sleeves, pants, a wide-brimmed hat, and sunglasses whenever you are outside. HEART DISEASE, DIABETES, AND HIGH BLOOD PRESSURE   High blood pressure causes heart disease and increases the risk of stroke. High blood pressure is more likely to develop in:  People who have blood pressure in the high end   of the normal range (130-139/85-89 mm Hg).  People who are overweight or obese.  People who are African American.  If you are 38-23 years of age, have your blood pressure checked every 3-5 years. If you are 61 years of age or older, have your blood pressure checked every year. You should have your blood pressure measured twice--once when you are at a hospital or clinic, and once when you are not at a hospital or clinic. Record the average of the two measurements. To check your blood pressure when you are not at a hospital or clinic, you can use:  An automated blood pressure machine at a pharmacy.  A home blood pressure monitor.  If you are between 45 years and 39 years old, ask your health care provider if you should take aspirin to prevent strokes.  Have regular diabetes screenings. This involves taking a blood sample to check your fasting blood sugar level.  If you are at a normal weight and have a low risk for diabetes,  have this test once every three years after 58 years of age.  If you are overweight and have a high risk for diabetes, consider being tested at a younger age or more often. PREVENTING INFECTION  Hepatitis B  If you have a higher risk for hepatitis B, you should be screened for this virus. You are considered at high risk for hepatitis B if:  You were born in a country where hepatitis B is common. Ask your health care provider which countries are considered high risk.  Your parents were born in a high-risk country, and you have not been immunized against hepatitis B (hepatitis B vaccine).  You have HIV or AIDS.  You use needles to inject street drugs.  You live with someone who has hepatitis B.  You have had sex with someone who has hepatitis B.  You get hemodialysis treatment.  You take certain medicines for conditions, including cancer, organ transplantation, and autoimmune conditions. Hepatitis C  Blood testing is recommended for:  Everyone born from [birth year] through 1963.  Anyone with known risk factors for hepatitis C. Sexually transmitted infections (STIs)  You should be screened for sexually transmitted infections (STIs) including gonorrhea and chlamydia if:  You are sexually active and are younger than 58 years of age.  You are older than 58 years of age and your health care provider tells you that you are at risk for this type of infection.  Your sexual activity has changed since you were last screened and you are at an increased risk for chlamydia or gonorrhea. Ask your health care provider if you are at risk.  If you do not have HIV, but are at risk, it may be recommended that you take a prescription medicine daily to prevent HIV infection. This is called pre-exposure prophylaxis (PrEP). You are considered at risk if:  You are sexually active and do not regularly use condoms or know the HIV status of your partner(s).  You take drugs by injection.  You are sexually  active with a partner who has HIV. Talk with your health care provider about whether you are at high risk of being infected with HIV. If you choose to begin PrEP, you should first be tested for HIV. You should then be tested every 3 months for as long as you are taking PrEP.  PREGNANCY   If you are premenopausal and you may become pregnant, ask your health care provider about preconception counseling.  If you may  become pregnant, take 400 to 800 micrograms (mcg) of folic acid every day.  If you want to prevent pregnancy, talk to your health care provider about birth control (contraception). OSTEOPOROSIS AND MENOPAUSE   Osteoporosis is a disease in which the bones lose minerals and strength with aging. This can result in serious bone fractures. Your risk for osteoporosis can be identified using a bone density scan.  If you are 77 years of age or older, or if you are at risk for osteoporosis and fractures, ask your health care provider if you should be screened.  Ask your health care provider whether you should take a calcium or vitamin D supplement to lower your risk for osteoporosis.  Menopause may have certain physical symptoms and risks.  Hormone replacement therapy may reduce some of these symptoms and risks. Talk to your health care provider about whether hormone replacement therapy is right for you.  HOME CARE INSTRUCTIONS   Schedule regular health, dental, and eye exams.  Stay current with your immunizations.   Do not use any tobacco products including cigarettes, chewing tobacco, or electronic cigarettes.  If you are pregnant, do not drink alcohol.  If you are breastfeeding, limit how much and how often you drink alcohol.  Limit alcohol intake to no more than 1 drink per day for nonpregnant women. One drink equals 12 ounces of beer, 5 ounces of wine, or 1 ounces of hard liquor.  Do not use street drugs.  Do not share needles.  Ask your health care provider for help if  you need support or information about quitting drugs.  Tell your health care provider if you often feel depressed.  Tell your health care provider if you have ever been abused or do not feel safe at home.   This information is not intended to replace advice given to you by your health care provider. Make sure you discuss any questions you have with your health care provider.   Document Released: 12/09/2010 Document Revised: 06/16/2014 Document Reviewed: 04/27/2013 Elsevier Interactive Patient Education [employer].   I will call you with labs once resulted. We will schedule your CT once I see when it was recommended.  I have increased your cymbalta to 60 mg daily, Follow up in 4 weeks to make sure tolerating dose and experiencing adequate coverage.  Once I research all records, if I see something to address we will call you.

## 2015-12-21 ENCOUNTER — Encounter: Payer: Self-pay | Admitting: Family Medicine

## 2015-12-21 ENCOUNTER — Telehealth: Payer: Self-pay | Admitting: Family Medicine

## 2015-12-21 DIAGNOSIS — I499 Cardiac arrhythmia, unspecified: Secondary | ICD-10-CM | POA: Insufficient documentation

## 2015-12-21 DIAGNOSIS — R7989 Other specified abnormal findings of blood chemistry: Secondary | ICD-10-CM | POA: Insufficient documentation

## 2015-12-21 DIAGNOSIS — K219 Gastro-esophageal reflux disease without esophagitis: Secondary | ICD-10-CM | POA: Insufficient documentation

## 2015-12-21 DIAGNOSIS — R945 Abnormal results of liver function studies: Secondary | ICD-10-CM

## 2015-12-21 LAB — COMPREHENSIVE METABOLIC PANEL
ALK PHOS: 137 U/L — AB (ref 39–117)
ALT: 33 U/L (ref 0–35)
AST: 26 U/L (ref 0–37)
Albumin: 4 g/dL (ref 3.5–5.2)
BILIRUBIN TOTAL: 0.3 mg/dL (ref 0.2–1.2)
BUN: 20 mg/dL (ref 6–23)
CALCIUM: 9.8 mg/dL (ref 8.4–10.5)
CO2: 27 meq/L (ref 19–32)
Chloride: 104 mEq/L (ref 96–112)
Creatinine, Ser: 0.87 mg/dL (ref 0.40–1.20)
GFR: 71.08 mL/min (ref >60.00)
Glucose, Bld: 108 mg/dL — ABNORMAL HIGH (ref 70–99)
POTASSIUM: 4.5 meq/L (ref 3.5–5.1)
Sodium: 142 mEq/L (ref 135–145)
Total Protein: 7.2 g/dL (ref 6.0–8.3)

## 2015-12-21 NOTE — Telephone Encounter (Signed)
Spoke with patient reviewed lab results and instructions. Scheduled patient for labs and office visit for next week. Patient verbalized understanding of all instructions.

## 2015-12-21 NOTE — Telephone Encounter (Signed)
Please call pt: - her labs are stable. Her liver studies are still elevated mildly and warrant more investigation.  - her vitamin D is still low (actually lower when checked in January). I am calling in a weekly supplement for her to start, she can continue OTC daily Vit D as well.   - I am placing future labs for her to have collected to further evaluate, she will then need to follow up with [place] 3 days after collection so we can discuss and possible order imaging.  - this needs to be a 30 minute appt, as I will also need to get an EKG on her to follow up on heart rate/ arrhythmia history.

## 2015-12-24 ENCOUNTER — Other Ambulatory Visit: Payer: Self-pay | Admitting: Family Medicine

## 2015-12-24 ENCOUNTER — Other Ambulatory Visit (INDEPENDENT_AMBULATORY_CARE_PROVIDER_SITE_OTHER): Payer: BLUE CROSS/BLUE SHIELD

## 2015-12-24 DIAGNOSIS — R7989 Other specified abnormal findings of blood chemistry: Secondary | ICD-10-CM

## 2015-12-24 DIAGNOSIS — E559 Vitamin D deficiency, unspecified: Secondary | ICD-10-CM | POA: Insufficient documentation

## 2015-12-24 DIAGNOSIS — R945 Abnormal results of liver function studies: Principal | ICD-10-CM

## 2015-12-24 LAB — GAMMA GT: GGT: 127 U/L — AB (ref 7–51)

## 2015-12-24 LAB — IRON AND TIBC
%SAT: 22 % (ref 11–50)
Iron: 89 ug/dL (ref 45–160)
TIBC: 413 ug/dL (ref 250–450)
UIBC: 324 ug/dL (ref 125–400)

## 2015-12-24 LAB — PHOSPHORUS: Phosphorus: 3.9 mg/dL (ref 2.3–4.6)

## 2015-12-24 LAB — FERRITIN: Ferritin: 60 ng/mL (ref 10.0–291.0)

## 2015-12-24 LAB — LACTATE DEHYDROGENASE: LDH: 159 U/L (ref 94–250)

## 2015-12-24 MED ORDER — VITAMIN D (ERGOCALCIFEROL) 1.25 MG (50000 UNIT) PO CAPS: 50000.0000 [IU] | ORAL_CAPSULE | ORAL | Status: DC

## 2015-12-25 LAB — ANA: Anti Nuclear Antibody(ANA): NEGATIVE

## 2015-12-26 ENCOUNTER — Ambulatory Visit (INDEPENDENT_AMBULATORY_CARE_PROVIDER_SITE_OTHER): Payer: BLUE CROSS/BLUE SHIELD | Admitting: Family Medicine

## 2015-12-26 ENCOUNTER — Encounter: Payer: Self-pay | Admitting: Family Medicine

## 2015-12-26 VITALS — BP 127/80 | HR 54 | Temp 98.0°F | Resp 20 | Ht 63.0 in | Wt 173.2 lb

## 2015-12-26 DIAGNOSIS — I499 Cardiac arrhythmia, unspecified: Secondary | ICD-10-CM

## 2015-12-26 DIAGNOSIS — R748 Abnormal levels of other serum enzymes: Secondary | ICD-10-CM | POA: Insufficient documentation

## 2015-12-26 DIAGNOSIS — R7989 Other specified abnormal findings of blood chemistry: Secondary | ICD-10-CM | POA: Diagnosis not present

## 2015-12-26 DIAGNOSIS — J309 Allergic rhinitis, unspecified: Secondary | ICD-10-CM

## 2015-12-26 DIAGNOSIS — R945 Abnormal results of liver function studies: Principal | ICD-10-CM

## 2015-12-26 DIAGNOSIS — R918 Other nonspecific abnormal finding of lung field: Secondary | ICD-10-CM | POA: Diagnosis not present

## 2015-12-26 MED ORDER — METOPROLOL SUCCINATE ER 25 MG PO TB24: 75.0000 mg | ORAL_TABLET | Freq: Every day | ORAL | Status: DC

## 2015-12-26 MED ORDER — FLUTICASONE PROPIONATE 50 MCG/ACT NA SUSP: 2.0000 | Freq: Every day | NASAL | Status: DC

## 2015-12-26 NOTE — Progress Notes (Signed)
Patient ID: Regina Black, female   DOB: 1956/04/22, 60 y.o.   MRN: [id number]    Regina Black , 1956/05/30, 60 y.o., female MRN: [id number] Patient Care Team    Relationship Specialty Notifications Start End  [name], DO PCP - General Family Medicine  11/22/15   Revonda Standard. Rohrbeck, MD  Cardiology  11/22/15   Luellen Pucker, MD  Obstetrics and Gynecology  12/10/15     CC: follow up on abnl liver enzymes and h/o arrhythmia.  Subjective:   Cardiac arrhythmia, unspecified cardiac arrhythmia type Pt has been prescribed metoprolol 100 mg for an "arrhytmia". She does not recall much of the history surrounding this medication. She states she was evaluated by cardiology and was told to follow up every 6 months (which would be October). She has no records with further information on this condition. PCP records indicate palpitations and PVC only. She is fatigued and mildly bradycardic on exam.   Allergic rhinitis, unspecified allergic rhinitis type: pt requesting refills on Flonase.   Multiple pulmonary nodules/elevated LFT, alk phos, GTT: lung nodule on CT 09/04/2014. Ground glass appearance mostly RUL largest 13 mm, several small pulmonary nodules largest RUL 24m. It was recommended by radiology f/u in 3 months. NO repeat CT scan has been completed. She is a former smoker, about a 10 pack year history. No other known exposures or travel. Pt has complained of fatigue. She has elevated ALT, Alk Phos and GTT. Negative hep panel, HIV, ANA. Smooth muscle-ab pending. Pt is on Cymbalta, which can cause elevated ALT, however med has been long term, and liver studies abnormal last 1.5 years, and elevated GTT and alk phos. No dedicated imaging has been completed of liver to date. Cholesterol/lipids are not elevated.    Allergies  Allergen Reactions  . Butorphanol Other (See Comments)    syncope  . Codeine Nausea And Vomiting   Social History  Substance Use Topics  . Smoking status: Former  SResearch scientist (life sciences) . Smokeless tobacco: Never Used  . Alcohol Use: 1.2 oz/week    2 Glasses of wine, 0 Standard drinks or equivalent per week   Past Medical History  Diagnosis Date  . Allergy   . Anxiety   . Asthma   . Blood transfusion without reported diagnosis   . Chicken pox   . Depression   . GERD (gastroesophageal reflux disease)   . Heart murmur   . Vitamin D deficiency   . Prediabetes   . Hyperlipidemia   . Thyroid nodule   . History of cardiovascular stress test 04/2013    negative per OV note from Dr. RMarijo File . History of echocardiogram 04/2013    normal study; EF 75% per Dr. RMarijo File . PVC (premature ventricular contraction) 2014    palpitations  . History of EKG 2016    NSR with PVC, Dr. RMarijo File . Hypokalemia   . History of Holter monitoring 2014    palpitations/pvc  . Fatty liver   . PCR DNA positive for HSV1   . Osteoarthritis   . Eczema   . Vertigo   . Arrhythmia   . Prediabetes   . Endometriosis    Past Surgical History  Procedure Laterality Date  . Breast surgery      reduction  . Rectal surgery    . Tonsillectomy and adenoidectomy    . Rectocele repair  07/2014  . Tubal ligation    . Thyroid nodule asp  01/11/2013  . Exploratory laparotomy  endometriosis   Family History  Problem Relation Age of Onset  . Lung cancer Mother   . Heart disease Mother   . Alcohol abuse Father   . Heart disease Father   . Breast cancer Paternal Aunt 32  . Heart disease Maternal Grandmother   . Arthritis Maternal Grandmother   . Arthritis Maternal Grandfather   . Heart disease Maternal Grandfather   . Hearing loss Maternal Grandfather   . Stroke Maternal Grandfather   . Diabetes Paternal Grandmother   . Hearing loss Paternal Grandfather      Medication List       This list is accurate as of: 12/26/15 11:21 AM.  Always use your most recent med list.               DULoxetine 60 MG capsule  Commonly known as:  CYMBALTA  Take 1 capsule (60 mg total) by  mouth daily.     hydrochlorothiazide 25 MG tablet  Commonly known as:  HYDRODIURIL  Take 25 mg by mouth daily.     ibuprofen 800 MG tablet  Commonly known as:  ADVIL,MOTRIN  Take 800 mg by mouth.     lisinopril 40 MG tablet  Commonly known as:  PRINIVIL,ZESTRIL  Take 40 mg by mouth daily.     metoprolol succinate 100 MG 24 hr tablet  Commonly known as:  TOPROL-XL  Take 100 mg by mouth daily. Take with or immediately following a meal.     omeprazole 20 MG capsule  Commonly known as:  PRILOSEC  Take 20 mg by mouth.     penciclovir 1 % cream  Commonly known as:  DENAVIR  Apply topically.     valACYclovir 500 MG tablet  Commonly known as:  VALTREX  Take 500 mg by mouth.     Vitamin D (Ergocalciferol) 50000 units Caps capsule  Commonly known as:  DRISDOL  Take 1 capsule (50,000 Units total) by mouth every 7 (seven) days.        No results found for this or any previous visit (from the past 24 hour(s)). No results found.   ROS: Negative, with the exception of above mentioned in HPI  Objective:  BP 127/80 mmHg  Pulse 54  Temp(Src) 98 F (36.7 C)  Resp 20  Ht 5' 3"  (1.6 m)  Wt 173 lb 4 oz (78.586 kg)  BMI 30.70 kg/m2  SpO2 97% Body mass index is 30.7 kg/(m^2). Gen: Afebrile. No acute distress. Nontoxic in appearance, well developed, well nourished. Pleasant, caucasian, female.  HENT: AT. [place]. MMM Eyes:Pupils Equal Round Reactive to light, Extraocular movements intact,  Conjunctiva without redness, discharge or icterus. CV: bradycardia.  Chest: CTAB, no wheeze or crackles. Good air movement, normal resp effort.  Abd: Soft.obese. NTND. BS present. Palpable liver 1-2 cm below rib cage.   Neuro: Normal gait. PERLA. EOMi. Alert. Oriented x3  Psych: Normal affect, dress and demeanor. Normal speech. Normal thought content and judgment. EKG: Sinus bradycardia. HR 53, PR 142, Qtc 426.No ST-T wave changes.   Assessment/Plan: [name] is a 60 y.o. female present  for acute OV for  Cardiac arrhythmia, unspecified cardiac arrhythmia type - Decrease dose to 75 mg of metoprolol daily. Pt to monitor closely for palpitations or HR >90. If occur she is to restart 100 mg dose. If HR still remains <60, can attempt 50 mg with same monitoring after 1 week.  - EKG 12-Lead: Bradycardia.  - metoprolol succinate (TOPROL-XL) 25 MG 24 hr tablet; Take  3 tablets (75 mg total) by mouth daily. Take with or immediately following a meal.  Dispense: 90 tablet; Refill: 1  Allergic rhinitis, unspecified allergic rhinitis type - refill on flonase  Multiple pulmonary nodules/ground glass appearance.  - 10-15 pack year smoking history with abnormal Chest imaging over a year ago, with recommended f/u CT with contrast in 3 mos.  - CT Chest W Contrast; Future - CT ABDOMEN LIMITED W CONTRAST; Future  Elevated LFTs/ Elevated alkaline phosphatase level/Elevated serum gamma-glutamyl transferase level - CT ABDOMEN LIMITED W CONTRAST; Future  > 25 minutes spent with patient, >50% of time spent face to face counseling patient and coordinating care.    electronically signed by:  Howard Pouch, DO  [place]

## 2015-12-26 NOTE — Patient Instructions (Signed)
I am ordering a CT of the chest and abdomen to followup on pulmonary nodule and evaluate the liver fully. We will call you to set up.  I have ordered metoprolol 25 mg, take 3 tabs at same time to equal 75 mg daily. Monitor for any palpitations and HR > 90.

## 2015-12-27 ENCOUNTER — Telehealth: Payer: Self-pay | Admitting: Family Medicine

## 2015-12-27 ENCOUNTER — Encounter: Payer: Self-pay | Admitting: Family Medicine

## 2015-12-27 DIAGNOSIS — R748 Abnormal levels of other serum enzymes: Secondary | ICD-10-CM

## 2015-12-27 DIAGNOSIS — R7989 Other specified abnormal findings of blood chemistry: Secondary | ICD-10-CM

## 2015-12-27 DIAGNOSIS — R945 Abnormal results of liver function studies: Secondary | ICD-10-CM

## 2015-12-27 LAB — ANTI-SMOOTH MUSCLE ANTIBODY, IGG: Smooth Muscle Ab: <20 U (ref <20)

## 2015-12-28 ENCOUNTER — Other Ambulatory Visit: Payer: Self-pay | Admitting: Family Medicine

## 2015-12-28 DIAGNOSIS — R7989 Other specified abnormal findings of blood chemistry: Secondary | ICD-10-CM

## 2015-12-28 DIAGNOSIS — R945 Abnormal results of liver function studies: Principal | ICD-10-CM

## 2015-12-28 DIAGNOSIS — R748 Abnormal levels of other serum enzymes: Secondary | ICD-10-CM

## 2015-12-28 NOTE — Telephone Encounter (Signed)
Thank you. I was able to get it authorized when I called back :)

## 2016-01-02 DIAGNOSIS — J3089 Other allergic rhinitis: Secondary | ICD-10-CM | POA: Diagnosis not present

## 2016-01-02 DIAGNOSIS — I10 Essential (primary) hypertension: Secondary | ICD-10-CM | POA: Diagnosis not present

## 2016-01-21 ENCOUNTER — Encounter (HOSPITAL_BASED_OUTPATIENT_CLINIC_OR_DEPARTMENT_OTHER): Payer: Self-pay

## 2016-01-21 ENCOUNTER — Ambulatory Visit (HOSPITAL_BASED_OUTPATIENT_CLINIC_OR_DEPARTMENT_OTHER)
Admission: RE | Admit: 2016-01-21 | Discharge: 2016-01-21 | Disposition: A | Discharge status: Home / Self Care | Payer: BLUE CROSS/BLUE SHIELD | Source: Ambulatory Visit | Attending: Family Medicine | Admitting: Family Medicine

## 2016-01-21 DIAGNOSIS — R748 Abnormal levels of other serum enzymes: Secondary | ICD-10-CM | POA: Diagnosis not present

## 2016-01-21 DIAGNOSIS — R845 Abnormal microbiological findings in specimens from respiratory organs and thorax: Secondary | ICD-10-CM | POA: Diagnosis not present

## 2016-01-21 DIAGNOSIS — I700 Atherosclerosis of aorta: Secondary | ICD-10-CM | POA: Insufficient documentation

## 2016-01-21 DIAGNOSIS — I2510 Atherosclerotic heart disease of native coronary artery without angina pectoris: Secondary | ICD-10-CM | POA: Insufficient documentation

## 2016-01-21 DIAGNOSIS — R918 Other nonspecific abnormal finding of lung field: Secondary | ICD-10-CM | POA: Diagnosis not present

## 2016-01-21 HISTORY — DX: Essential (primary) hypertension: I10

## 2016-01-21 MED ORDER — IOPAMIDOL (ISOVUE-300) INJECTION 61%
100.0000 mL | Freq: Once | INTRAVENOUS | Status: AC | PRN
  Administered 2016-01-21: 80 mL via INTRAVENOUS

## 2016-01-23 ENCOUNTER — Telehealth: Payer: Self-pay | Admitting: Family Medicine

## 2016-01-23 DIAGNOSIS — B001 Herpesviral vesicular dermatitis: Secondary | ICD-10-CM | POA: Diagnosis not present

## 2016-01-23 DIAGNOSIS — R918 Other nonspecific abnormal finding of lung field: Secondary | ICD-10-CM

## 2016-01-23 DIAGNOSIS — L700 Acne vulgaris: Secondary | ICD-10-CM | POA: Diagnosis not present

## 2016-01-23 NOTE — Telephone Encounter (Signed)
Patient calling to see if CT results are available. Advised patient that Dr. Raoul Pitch will be back 01/28/16.

## 2016-01-24 NOTE — Telephone Encounter (Signed)
Dr Anitra Lauth, could you please advise?

## 2016-01-25 NOTE — Telephone Encounter (Signed)
Stable pulmonary nodules. F/u CT recommended in 12 months.

## 2016-01-28 ENCOUNTER — Telehealth: Payer: Self-pay | Admitting: *Deleted

## 2016-01-28 NOTE — Telephone Encounter (Signed)
Spoke with patient reviewed test results and information regarding referral to pulmonology.

## 2016-01-28 NOTE — Telephone Encounter (Signed)
Patient aware of results per Dr. Anitra Lauth.

## 2016-01-28 NOTE — Addendum Note (Signed)
Addended by: Howard Pouch A on: 01/28/2016 01:26 PM   Modules accepted: Orders

## 2016-01-28 NOTE — Telephone Encounter (Signed)
Pt calling to discuss results of labs/images.  - [place] of her liver was normal.  - CT chest for known pulm nodules demonstrated many nodules bilaterally. Since she was a smoker, has fatigue and multiple nodules that will need followed, I am referring her to pulmonology. Her CT results recommended yearly follow up on these findings.

## 2016-01-28 NOTE — Telephone Encounter (Signed)
Patient called and left message requesting to be called to review results of recent imaging studies. Patient states she saw them on my chart and is requesting more information. These results were given to patient after review by Dr Anitra Lauth on 01/23/16. Please advise.

## 2016-02-12 ENCOUNTER — Telehealth: Payer: Self-pay | Admitting: Pulmonary Disease

## 2016-02-12 ENCOUNTER — Other Ambulatory Visit (INDEPENDENT_AMBULATORY_CARE_PROVIDER_SITE_OTHER): Payer: BLUE CROSS/BLUE SHIELD

## 2016-02-12 ENCOUNTER — Encounter: Payer: Self-pay | Admitting: Pulmonary Disease

## 2016-02-12 ENCOUNTER — Ambulatory Visit (INDEPENDENT_AMBULATORY_CARE_PROVIDER_SITE_OTHER): Payer: BLUE CROSS/BLUE SHIELD | Admitting: Pulmonary Disease

## 2016-02-12 DIAGNOSIS — R918 Other nonspecific abnormal finding of lung field: Secondary | ICD-10-CM

## 2016-02-12 LAB — CBC WITH DIFFERENTIAL/PLATELET
BASOS ABS: 0.1 10*3/uL (ref 0.0–0.1)
Basophils Relative: 0.8 % (ref 0.0–3.0)
EOS ABS: 1 10*3/uL — AB (ref 0.0–0.7)
Eosinophils Relative: 11.6 % — ABNORMAL HIGH (ref 0.0–5.0)
HEMATOCRIT: 38 % (ref 36.0–46.0)
HEMOGLOBIN: 13.2 g/dL (ref 12.0–15.0)
LYMPHS PCT: 35.8 % (ref 12.0–46.0)
Lymphs Abs: 3 10*3/uL (ref 0.7–4.0)
MCHC: 34.7 g/dL (ref 30.0–36.0)
MCV: 87.3 fl (ref 78.0–100.0)
MONO ABS: 0.5 10*3/uL (ref 0.1–1.0)
Monocytes Relative: 5.9 % (ref 3.0–12.0)
NEUTROS ABS: 3.8 10*3/uL (ref 1.4–7.7)
Neutrophils Relative %: 45.9 % (ref 43.0–77.0)
PLATELETS: 303 10*3/uL (ref 150.0–400.0)
RBC: 4.35 Mil/uL (ref 3.87–5.11)
RDW: 13.5 % (ref 11.5–15.5)
WBC: 8.3 10*3/uL (ref 4.0–10.5)

## 2016-02-12 NOTE — Progress Notes (Signed)
Subjective:    Patient ID: Regina Black, female    DOB: 07-06-1955, 60 y.o.   MRN: [id number]  HPI Chief Complaint  Patient presents with  . Advice Only    referred by Dr. Raoul Pitch for pulm nodules.     Pulmonary nodules:  > they were first discovered when she was hospitalized in 2016 for heart and blood pressure issues.  During that visit she had abnormal chest imaging which showed nodules so she was referred to [place].   > she has had serial CT scans performed to evaluate these further > she had no respiratory problems at the time, no recent respiratory infections.  She denies respiratory complaints.  She sometimes has a dry cough which she attributes to sinus drainage.  She sometimes feels "heavy chested" like she can't get a deep breath.  She said that this has been present for years and is mild.    Her mother has a history of lung cancer which was recurrent.  This has been treated with surgery, XRT and chemo.  Regina Black is a former smoker.  She smoked 25 years, 2 ppd.  She quit in 2000.  She has never been told she had a lung problem until this recent diagnosis of pulmonary nodules.    She has no history of lung cancer. Weight stable, no hemoptysis.   Past Medical History:  Diagnosis Date  . Allergy   . Anxiety   . Arrhythmia   . Asthma   . Blood transfusion without reported diagnosis   . Chicken pox   . Depression   . Diverticulosis 2010   Mild left sided by EGD/Colonoscopy Dr. Alice Reichert  . Eczema   . Endometriosis   . Fatty liver   . GERD (gastroesophageal reflux disease)   . Heart murmur   . Hiatal hernia 2010   "small"  . History of cardiovascular stress test 04/2013   negative per OV note from Dr. Marijo File  . History of echocardiogram 04/2013   normal study; EF 75% per Dr. Marijo File  . History of EKG 2016   NSR with PVC, Dr. Marijo File  . History of Holter monitoring 2014   palpitations/pvc  . Hyperlipidemia   . Hypertension   . Hypokalemia   . Internal  hemorrhoid 2010   small  . Osteoarthritis   . PCR DNA positive for HSV1   . Prediabetes   . Prediabetes   . PVC (premature ventricular contraction) 2014   palpitations  . Thyroid nodule   . Vertigo   . Vitamin D deficiency      Family History  Problem Relation Age of Onset  . Lung cancer Mother   . Heart disease Mother   . Alcohol abuse Father   . Heart disease Father   . Breast cancer Paternal Aunt 80  . Heart disease Maternal Grandmother   . Arthritis Maternal Grandmother   . Arthritis Maternal Grandfather   . Heart disease Maternal Grandfather   . Hearing loss Maternal Grandfather   . Stroke Maternal Grandfather   . Diabetes Paternal Grandmother   . Hearing loss Paternal Grandfather      Social History   Social History  . Marital status: Married    Spouse name: N/A  . Number of children: N/A  . Years of education: N/A   Occupational History  . Not on file.   Social History Main Topics  . Smoking status: Former Smoker    Packs/day: 2.00    Years: 25.00  Types: Cigarettes    Quit date: 06/09/1998  . Smokeless tobacco: Never Used  . Alcohol use 1.2 oz/week    2 Glasses of wine per week  . Drug use: No  . Sexual activity: Yes    Birth control/ protection: None   Other Topics Concern  . Not on file   Social History Narrative   Married, Regina Black. No children.   Mini dachshund (saddie-mae)   HS diploma- Finance.    Former smoker, no etoh or drugs.   Drinks caffeine   Wears seatbelt   Exercises routinely   Smoke detector in the home.    Forearms in the home, locked.    Feels safe in her relationships.      Allergies  Allergen Reactions  . Butorphanol Other (See Comments)    syncope  . Codeine Nausea And Vomiting     Outpatient Medications Prior to Visit  Medication Sig Dispense Refill  . DULoxetine (CYMBALTA) 60 MG capsule Take 1 capsule (60 mg total) by mouth daily. 30 capsule 1  . fluticasone (FLONASE) 50 MCG/ACT nasal spray Place 2 sprays  into both nostrils daily. 16 g 6  . hydrochlorothiazide (HYDRODIURIL) 25 MG tablet Take 25 mg by mouth daily.    Marland Kitchen ibuprofen (ADVIL,MOTRIN) 800 MG tablet Take 800 mg by mouth every 8 (eight) hours as needed.     Marland Kitchen lisinopril (PRINIVIL,ZESTRIL) 40 MG tablet Take 40 mg by mouth daily.    . metoprolol succinate (TOPROL-XL) 25 MG 24 hr tablet Take 3 tablets (75 mg total) by mouth daily. Take with or immediately following a meal. 90 tablet 1  . omeprazole (PRILOSEC) 20 MG capsule Take 20 mg by mouth daily.     Marland Kitchen penciclovir (DENAVIR) 1 % cream Apply topically as needed.     . valACYclovir (VALTREX) 500 MG tablet Take 500 mg by mouth as needed.     . Vitamin D, Ergocalciferol, (DRISDOL) 50000 units CAPS capsule Take 1 capsule (50,000 Units total) by mouth every 7 (seven) days. 12 capsule 0   No facility-administered medications prior to visit.        Review of Systems  Constitutional: Negative for fever and unexpected weight change.  HENT: Positive for postnasal drip. Negative for congestion, dental problem, ear pain, nosebleeds, rhinorrhea, sinus pressure, sneezing, sore throat and trouble swallowing.   Eyes: Negative for redness and itching.  Respiratory: Positive for shortness of breath. Negative for cough, chest tightness and wheezing.   Cardiovascular: Negative for palpitations and leg swelling.  Gastrointestinal: Negative for nausea and vomiting.  Genitourinary: Negative for dysuria.  Musculoskeletal: Negative for joint swelling.  Skin: Negative for rash.  Neurological: Negative for headaches.  Hematological: Does not bruise/bleed easily.  Psychiatric/Behavioral: Negative for dysphoric mood. The patient is not nervous/anxious.        Objective:   Physical Exam Vitals:   02/12/16 1431  BP: 136/68  Pulse: 70  SpO2: 97%  Weight: 177 lb (80.3 kg)  Height: '5\' 3"'$  (1.6 m)    RA  Gen: well appearing, no acute distress HENT: NCAT, OP clear, neck supple without masses Eyes:  PERRL, EOMi Lymph: no cervical lymphadenopathy PULM: CTA B CV: RRR, no mgr, no JVD GI: BS+, soft, nontender, no hsm Derm: no rash or skin breakdown MSK: normal bulk and tone Neuro: A&Ox4, CN II-XII intact, strength 5/5 in all 4 extremities Psyche: normal mood and affect  CBC    Component Value Date/Time   WBC 7.3 12/20/2015 0833   RBC  4.55 12/20/2015 0833   HGB 13.5 12/20/2015 0833   HCT 40.3 12/20/2015 0833   PLT 339.0 12/20/2015 0833   MCV 88.8 12/20/2015 0833   MCHC 33.4 12/20/2015 0833   RDW 14.3 12/20/2015 0833   LYMPHSABS 1.8 12/20/2015 0833   MONOABS 0.3 12/20/2015 0833   EOSABS 0.8 (H) 12/20/2015 0833   BASOSABS 0.2 (H) 12/20/2015 0833         Assessment & Plan:  Multiple pulmonary nodules I have seen the images from her most recent CT chest (August 2017) and discuss them with my partner today in clinic. Quite concerned about the fact that the right upper lobe nodule in particular has grown to 1.5 cm compared to previous images. The fact that this is a groundglass nodule in a heavy smoker is worrisome for potential adenocarcinoma.  The differential diagnosis includes mold disease or some sort of other past infection. That I'm most concerned about the possibility of malignancy. I do not think that a PET scan would be helpful in this situation because of the high false negative rate in the setting of adenocarcinoma.  As the nodule in the right upper lobe seems to be the only one which has grown, I would favor biopsying that one.  Plan: Arrange for navigational biopsy some time later in the month of September 2017 CBC today Plan for BAL culture in addition to surg path and cytology studies for bronchoscopy    Current Outpatient Prescriptions:  .  cholecalciferol (VITAMIN D) 400 units TABS tablet, Take 400 Units by mouth daily., Disp: , Rfl:  .  DULoxetine (CYMBALTA) 60 MG capsule, Take 1 capsule (60 mg total) by mouth daily., Disp: 30 capsule, Rfl: 1 .   fluticasone (FLONASE) 50 MCG/ACT nasal spray, Place 2 sprays into both nostrils daily., Disp: 16 g, Rfl: 6 .  hydrochlorothiazide (HYDRODIURIL) 25 MG tablet, Take 25 mg by mouth daily., Disp: , Rfl:  .  ibuprofen (ADVIL,MOTRIN) 800 MG tablet, Take 800 mg by mouth every 8 (eight) hours as needed. , Disp: , Rfl:  .  lisinopril (PRINIVIL,ZESTRIL) 40 MG tablet, Take 40 mg by mouth daily., Disp: , Rfl:  .  metoprolol succinate (TOPROL-XL) 25 MG 24 hr tablet, Take 3 tablets (75 mg total) by mouth daily. Take with or immediately following a meal., Disp: 90 tablet, Rfl: 1 .  omeprazole (PRILOSEC) 20 MG capsule, Take 20 mg by mouth daily. , Disp: , Rfl:  .  penciclovir (DENAVIR) 1 % cream, Apply topically as needed. , Disp: , Rfl:  .  valACYclovir (VALTREX) 500 MG tablet, Take 500 mg by mouth as needed. , Disp: , Rfl:  .  Vitamin D, Ergocalciferol, (DRISDOL) 50000 units CAPS capsule, Take 1 capsule (50,000 Units total) by mouth every 7 (seven) days., Disp: 12 capsule, Rfl: 0

## 2016-02-12 NOTE — Addendum Note (Signed)
Addended by: Len Blalock on: 02/12/2016 04:29 PM   Modules accepted: Orders

## 2016-02-12 NOTE — Telephone Encounter (Signed)
Spoke with pt, aware of recs.  New ct ordered, super-D protocol.  Nothing further needed.

## 2016-02-12 NOTE — Addendum Note (Signed)
Addended by: Len Blalock on: 02/12/2016 03:24 PM   Modules accepted: Orders

## 2016-02-12 NOTE — Patient Instructions (Signed)
We will arrange for a navigational biopsy later this month We will call you when we have an exact date and time for that procedure

## 2016-02-12 NOTE — Telephone Encounter (Signed)
[place] radiology site says that they cannot make a Super D disc.  Will order a Super D study and have the disc sent to Dr. Lamonte Sakai.

## 2016-02-12 NOTE — Assessment & Plan Note (Signed)
I have seen the images from her most recent CT chest (August 2017) and discuss them with my partner today in clinic. Quite concerned about the fact that the right upper lobe nodule in particular has grown to 1.5 cm compared to previous images. The fact that this is a groundglass nodule in a heavy smoker is worrisome for potential adenocarcinoma.  The differential diagnosis includes mold disease or some sort of other past infection. That I'm most concerned about the possibility of malignancy. I do not think that a PET scan would be helpful in this situation because of the high false negative rate in the setting of adenocarcinoma.  As the nodule in the right upper lobe seems to be the only one which has grown, I would favor biopsying that one.  Plan: Arrange for navigational biopsy some time later in the month of September 2017 CBC today Plan for BAL culture in addition to surg path and cytology studies for bronchoscopy

## 2016-02-13 NOTE — Progress Notes (Signed)
Reviewed. Will plan for ENB with bx's RUL and possibly smaller nodules as well, BAL for cx data.

## 2016-02-20 ENCOUNTER — Telehealth: Payer: Self-pay | Admitting: Pulmonary Disease

## 2016-02-20 NOTE — Telephone Encounter (Signed)
Spoke with pt, advised of Regina Black's recs below.  Pt expressed understanding.  Spoke with MR regarding anesthesia status of ebus- he states this is done under general.  Pt is aware of this also.   Nothing further needed at this time.

## 2016-02-20 NOTE — Telephone Encounter (Signed)
The ct has been authorized and the biopsy get takern care of throught th IR'[facility]  when they set it up Regina Black

## 2016-02-20 NOTE — Telephone Encounter (Signed)
Patient calling to make sure that her CT and procedure for biopsy has been pre-approved through her insurance.  Also, she wants to know if she will be put under via general anesthetics?    [name] - please advise.

## 2016-02-21 ENCOUNTER — Encounter (HOSPITAL_COMMUNITY)
Admission: RE | Admit: 2016-02-21 | Discharge: 2016-02-21 | Disposition: A | Discharge status: Home / Self Care | Payer: BLUE CROSS/BLUE SHIELD | Source: Ambulatory Visit | Attending: Emergency Medicine | Admitting: Emergency Medicine

## 2016-02-21 ENCOUNTER — Encounter (HOSPITAL_COMMUNITY): Payer: Self-pay

## 2016-02-21 DIAGNOSIS — Z01812 Encounter for preprocedural laboratory examination: Secondary | ICD-10-CM | POA: Diagnosis not present

## 2016-02-21 DIAGNOSIS — R918 Other nonspecific abnormal finding of lung field: Secondary | ICD-10-CM | POA: Insufficient documentation

## 2016-02-21 HISTORY — DX: Other specified postprocedural states: Z98.890

## 2016-02-21 HISTORY — DX: Nausea with vomiting, unspecified: R11.2

## 2016-02-21 HISTORY — DX: Headache, unspecified: R51.9

## 2016-02-21 HISTORY — DX: Headache: R51

## 2016-02-21 LAB — PROTIME-INR
INR: 0.96
PROTHROMBIN TIME: 12.8 s (ref 11.4–15.2)

## 2016-02-21 LAB — APTT: APTT: 28 s (ref 24–36)

## 2016-02-21 LAB — COMPREHENSIVE METABOLIC PANEL
ALBUMIN: 4 g/dL (ref 3.5–5.0)
ALT: 21 U/L (ref 14–54)
ANION GAP: 8 (ref 5–15)
AST: 28 U/L (ref 15–41)
Alkaline Phosphatase: 76 U/L (ref 38–126)
BILIRUBIN TOTAL: 0.7 mg/dL (ref 0.3–1.2)
BUN: 17 mg/dL (ref 6–20)
CO2: 26 mmol/L (ref 22–32)
CREATININE: 0.88 mg/dL (ref 0.44–1.00)
Calcium: 9.4 mg/dL (ref 8.9–10.3)
Chloride: 104 mmol/L (ref 101–111)
GLUCOSE: 79 mg/dL (ref 65–99)
POTASSIUM: 3.4 mmol/L — AB (ref 3.5–5.1)
Sodium: 138 mmol/L (ref 135–145)
TOTAL PROTEIN: 7.5 g/dL (ref 6.5–8.1)

## 2016-02-21 LAB — CBC
HEMATOCRIT: 39.6 % (ref 36.0–46.0)
Hemoglobin: 12.8 g/dL (ref 12.0–15.0)
MCH: 29.4 pg (ref 26.0–34.0)
MCHC: 32.3 g/dL (ref 30.0–36.0)
MCV: 90.8 fL (ref 78.0–100.0)
Platelets: 297 10*3/uL (ref 150–400)
RBC: 4.36 MIL/uL (ref 3.87–5.11)
RDW: 13.2 % (ref 11.5–15.5)
WBC: 8.3 10*3/uL (ref 4.0–10.5)

## 2016-02-21 NOTE — Progress Notes (Addendum)
REQUESTED LAST OFFICE NOTE AND ANY CARDIAC STUDIES FROM [facility] DR. Carson Myrtle.  OFFICE STATED THEY WOULD FAX EKG, HOLTER MON, OV- PATIENT LAST SEEN IN 09/2014.    [phone]   PCP-  DR. KNEUEFF ([place])  [place] ([place])

## 2016-02-21 NOTE — Pre-Procedure Instructions (Signed)
Siana Panameno  02/21/2016      [facility] - [place], [place] - [address] [place] [address] Phone: [phone] Fax: [phone]    Your procedure is scheduled on  Wednesday  03/05/16  Report to [facility] Admitting at 630 A.M.  Call this number if you have problems the morning of surgery:  [phone]   Remember:  Do not eat food or drink liquids after midnight.  Take these medicines the morning of surgery with A SIP OF WATER   DULOXETINE (CYMBALTA), METOPROLOL (TOPROL), OMEPRAAZOLE (PRILOSEC)        (STOP 7 DAYS PRIOR TO SURGERY ASPRIN OR ASPIRIN PRODUCTS, IBUPROFEN/ ADVIL/ MOTRIN/ ALEVE, GOODY POWDERS/ BC'S, HERBAL MEDICINES)    Do not wear jewelry, make-up or nail polish.  Do not wear lotions, powders, or perfumes, or deoderant.  Do not shave 48 hours prior to surgery.  Men may shave face and neck.  Do not bring valuables to the hospital.  [facility] is not responsible for any belongings or valuables.  Contacts, dentures or bridgework may not be worn into surgery.  Leave your suitcase in the car.  After surgery it may be brought to your room.  For patients admitted to the hospital, discharge time will be determined by your treatment team.  Patients discharged the day of surgery will not be allowed to drive home.   Name and phone number of your driver:    Special instructions:  [facility] - Preparing for Surgery  Before surgery, you can play an important role.  Because skin is not sterile, your skin needs to be as free of germs as possible.  You can reduce the number of germs on you skin by washing with CHG (chlorahexidine gluconate) soap before surgery.  CHG is an antiseptic cleaner which kills germs and bonds with the skin to continue killing germs even after washing.  Please DO NOT use if you have an allergy to CHG or antibacterial soaps.  If your skin becomes reddened/irritated stop using the CHG and inform your  nurse when you arrive at Short Stay.  Do not shave (including legs and underarms) for at least 48 hours prior to the first CHG shower.  You may shave your face.  Please follow these instructions carefully:   1.  Shower with CHG Soap the night before surgery and the                                morning of Surgery.  2.  If you choose to wash your hair, wash your hair first as usual with your       normal shampoo.  3.  After you shampoo, rinse your hair and body thoroughly to remove the                      Shampoo.  4.  Use CHG as you would any other liquid soap.  You can apply chg directly       to the skin and wash gently with scrungie or a clean washcloth.  5.  Apply the CHG Soap to your body ONLY FROM THE NECK DOWN.        Do not use on open wounds or open sores.  Avoid contact with your eyes,       ears, mouth and genitals (private parts).  Wash genitals (private  parts)       with your normal soap.  6.  Wash thoroughly, paying special attention to the area where your surgery        will be performed.  7.  Thoroughly rinse your body with warm water from the neck down.  8.  DO NOT shower/wash with your normal soap after using and rinsing off       the CHG Soap.  9.  Pat yourself dry with a clean towel.            10.  Wear clean pajamas.            11.  Place clean sheets on your bed the night of your first shower and do not        sleep with pets.  Day of Surgery  Do not apply any lotions/deoderants the morning of surgery.  Please wear clean clothes to the hospital/surgery center.    Please read over the following fact sheets that you were given. Surgical Site Infection Prevention

## 2016-02-22 ENCOUNTER — Ambulatory Visit (INDEPENDENT_AMBULATORY_CARE_PROVIDER_SITE_OTHER)
Admission: RE | Admit: 2016-02-22 | Discharge: 2016-02-22 | Disposition: A | Discharge status: Home / Self Care | Payer: BLUE CROSS/BLUE SHIELD | Source: Ambulatory Visit | Attending: Pulmonary Disease | Admitting: Pulmonary Disease

## 2016-02-22 DIAGNOSIS — L700 Acne vulgaris: Secondary | ICD-10-CM | POA: Diagnosis not present

## 2016-02-22 DIAGNOSIS — R918 Other nonspecific abnormal finding of lung field: Secondary | ICD-10-CM | POA: Diagnosis not present

## 2016-02-22 MED ORDER — IOPAMIDOL (ISOVUE-300) INJECTION 61%
80.0000 mL | Freq: Once | INTRAVENOUS | Status: AC | PRN
  Administered 2016-02-22: 80 mL via INTRAVENOUS

## 2016-02-29 NOTE — Progress Notes (Signed)
Anesthesia chart review: Patient is a 59 year old female scheduled for video bronchoscopy with endobronchial navigation on 03/05/2016 by Dr. Lamonte Sakai. Diagnosis: lung nodule.  History includes former smoker, postoperative nausea and vomiting, anxiety, asthma, GERD, depression, murmur, hyperlipidemia, prediabetes, thyroid nodule s/p aspiration, PVCs (on b-blocker), fatty liver, vertigo, hiatal hernia, hypertension, T&A, breast surgery, exploratory laparotomy.  PCP is Dr. Howard Pouch. Pulmonologist is Dr. Lake Bells. She saw cardiologist Dr. Clarene Critchley (with [facility], but is now with [facility]) in 2014 for chest tightness and PVCs and had negative treadmill stress test and echo. By PCP notes, it appears she was started on B-blocker at that time.   Meds include Cymbalta, Flonase, HCTZ, lisinopril, Toprol XL, Prilosec.  BP (!) 153/71   Pulse (!) 55   Temp 36.7 C   Resp 20   Ht '5\' 4"'$  (1.626 m)   Wt 176 lb 11.2 oz (80.2 kg)   SpO2 98%   BMI 30.33 kg/m   12/26/15 EKG: SB.  05/11/2013 Echo: Conclusion: Normal LV systolic function. LVEF 75%. No significant valvular disease (trace TR).  05/11/2013 Treadmill Stress Test: Conclusion: Negative for ischemia and patient achieved adequate or target heart rate. Exercise tolerance, 8.8 METS. Normal blood pressure response to exercise. Chest pain at peak exercise was frequent PVCs and a few couplets during recovery. No ST segment depression noted.  02/22/16 CT Super D Chest w/ contrast: IMPRESSION: 1. Stable ground-glass nodules. 2. No mediastinal or hilar mass or adenopathy. 3. Stable left thyroid goiter.  Labs from 02/21/16 noted. CBC WNL. AST/ALT WNL. A1c 5.5 on 12/20/15.   If no acute changes then I anticipate that she can proceed as planned.  [facility] Short Stay Center/Anesthesiology Phone [phone] 02/29/2016 9:37 AM

## 2016-03-05 ENCOUNTER — Ambulatory Visit (HOSPITAL_COMMUNITY)
Admission: RE | Admit: 2016-03-05 | Discharge: 2016-03-05 | Disposition: A | Discharge status: Home / Self Care | Payer: BLUE CROSS/BLUE SHIELD | Source: Ambulatory Visit | Attending: Emergency Medicine | Admitting: Emergency Medicine

## 2016-03-05 ENCOUNTER — Ambulatory Visit (HOSPITAL_COMMUNITY): Payer: BLUE CROSS/BLUE SHIELD

## 2016-03-05 ENCOUNTER — Ambulatory Visit (HOSPITAL_COMMUNITY): Payer: BLUE CROSS/BLUE SHIELD | Admitting: Vascular Surgery

## 2016-03-05 ENCOUNTER — Encounter (HOSPITAL_COMMUNITY): Payer: Self-pay | Admitting: Certified Registered"

## 2016-03-05 ENCOUNTER — Encounter (HOSPITAL_COMMUNITY)
Admission: RE | Disposition: A | Discharge status: Home / Self Care | Payer: Self-pay | Source: Ambulatory Visit | Attending: Emergency Medicine

## 2016-03-05 ENCOUNTER — Institutional Professional Consult (permissible substitution): Payer: BLUE CROSS/BLUE SHIELD | Admitting: Pulmonary Disease

## 2016-03-05 DIAGNOSIS — Z803 Family history of malignant neoplasm of breast: Secondary | ICD-10-CM | POA: Diagnosis not present

## 2016-03-05 DIAGNOSIS — R7303 Prediabetes: Secondary | ICD-10-CM | POA: Diagnosis not present

## 2016-03-05 DIAGNOSIS — Z87891 Personal history of nicotine dependence: Secondary | ICD-10-CM | POA: Diagnosis not present

## 2016-03-05 DIAGNOSIS — J189 Pneumonia, unspecified organism: Secondary | ICD-10-CM | POA: Diagnosis not present

## 2016-03-05 DIAGNOSIS — J45909 Unspecified asthma, uncomplicated: Secondary | ICD-10-CM | POA: Insufficient documentation

## 2016-03-05 DIAGNOSIS — Z6830 Body mass index (BMI) 30.0-30.9, adult: Secondary | ICD-10-CM | POA: Diagnosis not present

## 2016-03-05 DIAGNOSIS — K760 Fatty (change of) liver, not elsewhere classified: Secondary | ICD-10-CM | POA: Insufficient documentation

## 2016-03-05 DIAGNOSIS — F419 Anxiety disorder, unspecified: Secondary | ICD-10-CM | POA: Diagnosis not present

## 2016-03-05 DIAGNOSIS — R011 Cardiac murmur, unspecified: Secondary | ICD-10-CM | POA: Diagnosis not present

## 2016-03-05 DIAGNOSIS — J9811 Atelectasis: Secondary | ICD-10-CM | POA: Diagnosis not present

## 2016-03-05 DIAGNOSIS — I10 Essential (primary) hypertension: Secondary | ICD-10-CM | POA: Diagnosis not present

## 2016-03-05 DIAGNOSIS — E785 Hyperlipidemia, unspecified: Secondary | ICD-10-CM | POA: Insufficient documentation

## 2016-03-05 DIAGNOSIS — Z9889 Other specified postprocedural states: Secondary | ICD-10-CM

## 2016-03-05 DIAGNOSIS — M1990 Unspecified osteoarthritis, unspecified site: Secondary | ICD-10-CM | POA: Diagnosis not present

## 2016-03-05 DIAGNOSIS — K449 Diaphragmatic hernia without obstruction or gangrene: Secondary | ICD-10-CM | POA: Insufficient documentation

## 2016-03-05 DIAGNOSIS — F329 Major depressive disorder, single episode, unspecified: Secondary | ICD-10-CM | POA: Insufficient documentation

## 2016-03-05 DIAGNOSIS — E876 Hypokalemia: Secondary | ICD-10-CM | POA: Diagnosis not present

## 2016-03-05 DIAGNOSIS — I499 Cardiac arrhythmia, unspecified: Secondary | ICD-10-CM

## 2016-03-05 DIAGNOSIS — Z801 Family history of malignant neoplasm of trachea, bronchus and lung: Secondary | ICD-10-CM | POA: Diagnosis not present

## 2016-03-05 DIAGNOSIS — K219 Gastro-esophageal reflux disease without esophagitis: Secondary | ICD-10-CM | POA: Insufficient documentation

## 2016-03-05 DIAGNOSIS — E559 Vitamin D deficiency, unspecified: Secondary | ICD-10-CM | POA: Diagnosis not present

## 2016-03-05 DIAGNOSIS — Z8249 Family history of ischemic heart disease and other diseases of the circulatory system: Secondary | ICD-10-CM | POA: Insufficient documentation

## 2016-03-05 DIAGNOSIS — I493 Ventricular premature depolarization: Secondary | ICD-10-CM | POA: Insufficient documentation

## 2016-03-05 DIAGNOSIS — Z823 Family history of stroke: Secondary | ICD-10-CM | POA: Diagnosis not present

## 2016-03-05 DIAGNOSIS — E669 Obesity, unspecified: Secondary | ICD-10-CM | POA: Diagnosis not present

## 2016-03-05 DIAGNOSIS — Z419 Encounter for procedure for purposes other than remedying health state, unspecified: Secondary | ICD-10-CM

## 2016-03-05 DIAGNOSIS — R918 Other nonspecific abnormal finding of lung field: Secondary | ICD-10-CM

## 2016-03-05 DIAGNOSIS — J8410 Pulmonary fibrosis, unspecified: Secondary | ICD-10-CM | POA: Diagnosis not present

## 2016-03-05 DIAGNOSIS — R911 Solitary pulmonary nodule: Secondary | ICD-10-CM | POA: Diagnosis not present

## 2016-03-05 DIAGNOSIS — Z833 Family history of diabetes mellitus: Secondary | ICD-10-CM | POA: Diagnosis not present

## 2016-03-05 HISTORY — PX: VIDEO BRONCHOSCOPY WITH ENDOBRONCHIAL NAVIGATION: SHX6175

## 2016-03-05 LAB — BODY FLUID CELL COUNT WITH DIFFERENTIAL
EOS FL: 6 %
Eos, Fluid: 2 %
LYMPHS FL: 30 %
Lymphs, Fluid: 25 %
Monocyte-Macrophage-Serous Fluid: 3 % — ABNORMAL LOW (ref 50–90)
Monocyte-Macrophage-Serous Fluid: 4 % — ABNORMAL LOW (ref 50–90)
NEUTROPHIL FLUID: 65 % — AB (ref 0–25)
NEUTROPHIL FLUID: 65 % — AB (ref 0–25)
Total Nucleated Cell Count, Fluid: 13 cu mm (ref 0–1000)
Total Nucleated Cell Count, Fluid: 24 cu mm (ref 0–1000)

## 2016-03-05 LAB — ACID FAST SMEAR (AFB, MYCOBACTERIA): Acid Fast Smear: NEGATIVE

## 2016-03-05 LAB — ACID FAST SMEAR (AFB): ACID FAST SMEAR - AFSCU2: NEGATIVE

## 2016-03-05 SURGERY — VIDEO BRONCHOSCOPY WITH ENDOBRONCHIAL NAVIGATION
Anesthesia: General

## 2016-03-05 MED ORDER — ONDANSETRON HCL 4 MG/2ML IJ SOLN
4.0000 mg | Freq: Once | INTRAMUSCULAR | Status: AC | PRN
  Administered 2016-03-05: 4 mg via INTRAVENOUS
  Filled 2016-03-05: qty 2

## 2016-03-05 MED ORDER — 0.9 % SODIUM CHLORIDE (POUR BTL) OPTIME
TOPICAL | Status: DC | PRN
  Administered 2016-03-05: 1000 mL

## 2016-03-05 MED ORDER — FLUTICASONE PROPIONATE 50 MCG/ACT NA SUSP: 2.0000 | Freq: Every day | NASAL | Status: AC | PRN

## 2016-03-05 MED ORDER — SCOPOLAMINE 1 MG/3DAYS TD PT72
MEDICATED_PATCH | TRANSDERMAL | Status: AC
  Filled 2016-03-05: qty 1

## 2016-03-05 MED ORDER — SUGAMMADEX SODIUM 200 MG/2ML IV SOLN
INTRAVENOUS | Status: AC
  Filled 2016-03-05: qty 2

## 2016-03-05 MED ORDER — SUGAMMADEX SODIUM 200 MG/2ML IV SOLN
INTRAVENOUS | Status: DC | PRN
  Administered 2016-03-05: 200 mg via INTRAVENOUS

## 2016-03-05 MED ORDER — ROCURONIUM BROMIDE 10 MG/ML (PF) SYRINGE
PREFILLED_SYRINGE | INTRAVENOUS | Status: AC
  Filled 2016-03-05: qty 10

## 2016-03-05 MED ORDER — PROPOFOL 10 MG/ML IV BOLUS
INTRAVENOUS | Status: AC
  Filled 2016-03-05: qty 20

## 2016-03-05 MED ORDER — MIDAZOLAM HCL 5 MG/5ML IJ SOLN
INTRAMUSCULAR | Status: DC | PRN
  Administered 2016-03-05: 2 mg via INTRAVENOUS

## 2016-03-05 MED ORDER — EPINEPHRINE HCL 1 MG/ML IJ SOLN
INTRAMUSCULAR | Status: AC
  Filled 2016-03-05: qty 1

## 2016-03-05 MED ORDER — SCOPOLAMINE 1 MG/3DAYS TD PT72
MEDICATED_PATCH | TRANSDERMAL | Status: DC | PRN
  Administered 2016-03-05: 1 via TRANSDERMAL

## 2016-03-05 MED ORDER — PROMETHAZINE HCL 25 MG/ML IJ SOLN
6.2500 mg | INTRAMUSCULAR | Status: DC | PRN
  Administered 2016-03-05: 6.25 mg via INTRAVENOUS

## 2016-03-05 MED ORDER — LACTATED RINGERS IV SOLN
INTRAVENOUS | Status: DC | PRN
  Administered 2016-03-05: 08:00:00 via INTRAVENOUS

## 2016-03-05 MED ORDER — EPHEDRINE SULFATE 50 MG/ML IJ SOLN
INTRAMUSCULAR | Status: DC | PRN
  Administered 2016-03-05: 10 mg via INTRAVENOUS
  Administered 2016-03-05 (×5): 5 mg via INTRAVENOUS

## 2016-03-05 MED ORDER — LIDOCAINE 2% (20 MG/ML) 5 ML SYRINGE
INTRAMUSCULAR | Status: DC | PRN
  Administered 2016-03-05: 60 mg via INTRAVENOUS

## 2016-03-05 MED ORDER — MIDAZOLAM HCL 2 MG/2ML IJ SOLN
INTRAMUSCULAR | Status: AC
  Filled 2016-03-05: qty 2

## 2016-03-05 MED ORDER — PROMETHAZINE HCL 25 MG/ML IJ SOLN
INTRAMUSCULAR | Status: AC
  Administered 2016-03-05: 6.25 mg via INTRAVENOUS
  Filled 2016-03-05: qty 1

## 2016-03-05 MED ORDER — ONDANSETRON HCL 4 MG/2ML IJ SOLN
INTRAMUSCULAR | Status: AC
  Filled 2016-03-05: qty 2

## 2016-03-05 MED ORDER — SUFENTANIL CITRATE 50 MCG/ML IV SOLN
INTRAVENOUS | Status: DC | PRN
  Administered 2016-03-05: 5 ug via INTRAVENOUS
  Administered 2016-03-05: 15 ug via INTRAVENOUS

## 2016-03-05 MED ORDER — PROPOFOL 10 MG/ML IV BOLUS
INTRAVENOUS | Status: DC | PRN
Start: 2016-03-05 — End: 2016-03-05
  Administered 2016-03-05: 170 mg via INTRAVENOUS

## 2016-03-05 MED ORDER — DEXAMETHASONE SODIUM PHOSPHATE 10 MG/ML IJ SOLN
INTRAMUSCULAR | Status: AC
  Filled 2016-03-05: qty 1

## 2016-03-05 MED ORDER — DEXAMETHASONE SODIUM PHOSPHATE 10 MG/ML IJ SOLN
INTRAMUSCULAR | Status: DC | PRN
  Administered 2016-03-05: 10 mg via INTRAVENOUS

## 2016-03-05 MED ORDER — SODIUM CHLORIDE 0.9 % IJ SOLN
INTRAMUSCULAR | Status: AC
  Filled 2016-03-05: qty 10

## 2016-03-05 MED ORDER — ONDANSETRON HCL 4 MG/2ML IJ SOLN
INTRAMUSCULAR | Status: DC | PRN
  Administered 2016-03-05: 4 mg via INTRAVENOUS

## 2016-03-05 MED ORDER — SUFENTANIL CITRATE 50 MCG/ML IV SOLN
INTRAVENOUS | Status: AC
  Filled 2016-03-05: qty 1

## 2016-03-05 MED ORDER — HYDROMORPHONE HCL 1 MG/ML IJ SOLN: 0.2500 mg | INTRAMUSCULAR | Status: DC | PRN

## 2016-03-05 MED ORDER — ROCURONIUM BROMIDE 10 MG/ML (PF) SYRINGE
PREFILLED_SYRINGE | INTRAVENOUS | Status: DC | PRN
  Administered 2016-03-05: 20 mg via INTRAVENOUS
  Administered 2016-03-05: 40 mg via INTRAVENOUS

## 2016-03-05 SURGICAL SUPPLY — 28 items
ADAPTER BRONCH F/PENTAX (ADAPTER) ×2 IMPLANT
BRUSH CYTOL CELLEBRITY 1.5X140 (MISCELLANEOUS) ×2 IMPLANT
BRUSH SUPERTRAX NDL-TIP CYTO (INSTRUMENTS) ×4 IMPLANT
CANISTER SUCTION 2500CC (MISCELLANEOUS) ×2 IMPLANT
CONT SPEC 4OZ CLIKSEAL STRL BL (MISCELLANEOUS) ×6 IMPLANT
COVER TABLE BACK 60X90 (DRAPES) ×2 IMPLANT
FORCEPS BIOP SUPERTRX PREMAR (INSTRUMENTS) ×2 IMPLANT
GAUZE SPONGE 4X4 12PLY STRL (GAUZE/BANDAGES/DRESSINGS) ×2 IMPLANT
GLOVE BIO SURGEON STRL SZ 6.5 (GLOVE) ×4 IMPLANT
GLOVE BIO SURGEON STRL SZ7.5 (GLOVE) ×4 IMPLANT
GOWN STRL REUS W/ TWL LRG LVL3 (GOWN DISPOSABLE) ×3 IMPLANT
GOWN STRL REUS W/TWL LRG LVL3 (GOWN DISPOSABLE) ×3
KIT CLEAN ENDO COMPLIANCE (KITS) ×2 IMPLANT
KIT PROCEDURE EDGE 180 (KITS) ×2 IMPLANT
KIT ROOM TURNOVER OR (KITS) ×2 IMPLANT
MARKER SKIN DUAL TIP RULER LAB (MISCELLANEOUS) ×2 IMPLANT
NEEDLE SUPERTRX PREMARK BIOPSY (NEEDLE) ×6 IMPLANT
NS IRRIG 1000ML POUR BTL (IV SOLUTION) ×2 IMPLANT
OIL SILICONE PENTAX (PARTS (SERVICE/REPAIRS)) ×2 IMPLANT
PAD ARMBOARD 7.5X6 YLW CONV (MISCELLANEOUS) ×4 IMPLANT
PATCHES PATIENT (LABEL) ×2 IMPLANT
SYR 20CC LL (SYRINGE) ×2 IMPLANT
SYR 20ML ECCENTRIC (SYRINGE) ×2 IMPLANT
SYR 50ML SLIP (SYRINGE) ×2 IMPLANT
TOWEL OR 17X24 6PK STRL BLUE (TOWEL DISPOSABLE) ×2 IMPLANT
TRAP SPECIMEN MUCOUS 40CC (MISCELLANEOUS) ×2 IMPLANT
TUBE CONNECTING 20X1/4 (TUBING) ×2 IMPLANT
WATER STERILE IRR 1000ML POUR (IV SOLUTION) ×4 IMPLANT

## 2016-03-05 NOTE — Op Note (Signed)
Video Bronchoscopy with Electromagnetic Navigation Procedure Note  Date of Operation: 03/05/2016  Pre-op Diagnosis: Bilateral groundglass pulmonary nodules  Post-op Diagnosis: Same  Surgeon: Baltazar Apo  Assistants: None  Anesthesia: General endotracheal anesthesia  Operation: Flexible video fiberoptic bronchoscopy with electromagnetic navigation and biopsies.  Estimated Blood Loss: Minimal  Complications: None apparent  Indications and History: Regina Black is a 60 y.o. female with history of tobacco use and a family history of lung cancer. She was noted to have asymptomatic groundglass pulmonary nodules on CT scan of the chest. Given her potential risk for lung cancer recommendation was made to achieve tissue diagnosis and culture data via navigational bronchoscopy. The risks, benefits, complications, treatment options and expected outcomes were discussed with the patient.  The possibilities of pneumothorax, pneumonia, reaction to medication, pulmonary aspiration, perforation of a viscus, bleeding, failure to diagnose a condition and creating a complication requiring transfusion or operation were discussed with the patient who freely signed the consent.    Description of Procedure: The patient was seen in the Preoperative Area, was examined and was deemed appropriate to proceed.  The patient was taken to OR 10, identified as Regina Black and the procedure verified as Flexible Video Fiberoptic Bronchoscopy.  A Time Out was held and the above information confirmed.   Prior to the date of the procedure a high-resolution CT scan of the chest was performed. Utilizing [place] a virtual tracheobronchial tree was generated to allow the creation of distinct navigation pathways to the patient's parenchymal abnormalities. After being taken to the operating room general anesthesia was initiated and the patient  was orally intubated. The video fiberoptic bronchoscope was  introduced via the endotracheal tube and a general inspection was performed which showed normal airways throughout. The extendable working channel and locator guide were introduced into the bronchoscope. The distinct navigation pathways prepared prior to this procedure were then utilized to navigate to within 0.5cm of patient's predominant RUL lesion and to within 1cm of the largest LUL nodule identified on CT scan. The extendable working channel was secured into place at each location and the locator guide was withdrawn. Under fluoroscopic guidance transbronchial needle brushings, transbronchial Wang needle biopsies, and transbronchial forceps biopsies were performed to be sent for cytology and pathology from both locations. A bronchioalveolar lavage was performed in the RUL and again in the LUL and sent for cell count, cytology and microbiology (bacterial, fungal, AFB smears and cultures). At the end of the procedure a general airway inspection was performed and there was no evidence of active bleeding. The bronchoscope was removed.  The patient tolerated the procedure well. There was no significant blood loss and there were no obvious complications. A post-procedural chest x-ray is pending.  Samples: 1. Transbronchial needle brushings from RUL and LUL nodules 2. Transbronchial Wang needle biopsies from RUL and LUL nodules 3. Transbronchial forceps biopsies from RUL and LUL nodules 4. Bronchoalveolar lavage from RUL and LUL   Plans:  The patient will be discharged from the PACU to home when recovered from anesthesia and after chest x-ray is reviewed. We will review the cytology, pathology and microbiology results with the patient when they become available. Outpatient followup will be with Dr Lake Bells.   Baltazar Apo, MD, PhD 03/05/2016, 10:10 AM [facility] [phone] or if no answer [phone]

## 2016-03-05 NOTE — Transfer of Care (Signed)
Immediate Anesthesia Transfer of Care Note  Patient: Regina Black  Procedure(s) Performed: Procedure(s): VIDEO BRONCHOSCOPY WITH ENDOBRONCHIAL NAVIGATION (N/A)  Patient Location: PACU  Anesthesia Type:General  Level of Consciousness: awake, oriented and patient cooperative  Airway & Oxygen Therapy: Patient Spontanous Breathing and Patient connected to nasal cannula oxygen  Post-op Assessment: Report given to RN, Post -op Vital signs reviewed and stable and Patient moving all extremities  Post vital signs: Reviewed and stable  Last Vitals:  Vitals:   03/05/16 0639  BP: (!) 171/85  Pulse: 65  Resp: 20  Temp: 36.5 C    Last Pain:  Vitals:   03/05/16 0729  TempSrc:   PainSc: 3          Complications: No apparent anesthesia complications

## 2016-03-05 NOTE — Interval H&P Note (Signed)
PCCM Interval Note  Pt presents today for further evaluation of GG nodules bilaterally. She has a family hx NSCLCA, former smoker 71 pk-yrs. She is asymptomatic, reports no new sx today.   Explained to her the rationale, risks, benefits of navigational FOB. Explained that would be done under general anesthesia. All questions answered.   Vitals:   03/05/16 0639  BP: (!) 171/85  Pulse: 65  Resp: 20  Temp: 97.7 F (36.5 C)  TempSrc: Oral  SpO2: 100%  Weight: 79.8 kg (176 lb)   Gen: Pleasant, well-nourished, in no distress,  normal affect  ENT: No lesions,  mouth clear,  oropharynx clear  Neck: No JVD, no TMG, no carotid bruits  Lungs: No use of accessory muscles, clear without rales or rhonchi  Cardiovascular: RRR, heart sounds normal, no murmur or gallops  Neuro: alert, non focal  Skin: Warm, no lesions or rashes    Plans: will proceed with ENB for bilateral GG nodules, plan will be to attempt sampling of both, BAL for cytology and cx data   Baltazar Apo, MD, PhD 03/05/2016, 8:33 AM [facility] [phone] or if no answer [phone]

## 2016-03-05 NOTE — Anesthesia Postprocedure Evaluation (Signed)
Anesthesia Post Note  Patient: Regina Black  Procedure(s) Performed: Procedure(s) (LRB): VIDEO BRONCHOSCOPY WITH ENDOBRONCHIAL NAVIGATION (N/A)  Patient location during evaluation: PACU Anesthesia Type: General Level of consciousness: awake and alert Pain management: pain level controlled Vital Signs Assessment: post-procedure vital signs reviewed and stable Respiratory status: spontaneous breathing, nonlabored ventilation, respiratory function stable and patient connected to nasal cannula oxygen Cardiovascular status: blood pressure returned to baseline and stable Postop Assessment: no signs of nausea or vomiting Anesthetic complications: no    Last Vitals:  Vitals:   03/05/16 1145 03/05/16 1219  BP: 106/64 135/72  Pulse: 72 73  Resp: 19 20  Temp:      Last Pain:  Vitals:   03/05/16 1145  TempSrc:   PainSc: 0-No pain                 Charnel Giles,JAMES TERRILL

## 2016-03-05 NOTE — H&P (View-Only) (Signed)
Subjective:    Patient ID: Regina Black, female    DOB: [birth year], 60 y.o.   MRN: [id number]  HPI Chief Complaint  Patient presents with  . Advice Only    referred by Dr. Raoul Pitch for pulm nodules.     Pulmonary nodules:  > they were first discovered when she was hospitalized in 2016 for heart and blood pressure issues.  During that visit she had abnormal chest imaging which showed nodules so she was referred to [place].   > she has had serial CT scans performed to evaluate these further > she had no respiratory problems at the time, no recent respiratory infections.  She denies respiratory complaints.  She sometimes has a dry cough which she attributes to sinus drainage.  She sometimes feels "heavy chested" like she can't get a deep breath.  She said that this has been present for years and is mild.    Her mother has a history of lung cancer which was recurrent.  This has been treated with surgery, XRT and chemo.  Regina Black is a former smoker.  She smoked 25 years, 2 ppd.  She quit in 2000.  She has never been told she had a lung problem until this recent diagnosis of pulmonary nodules.    She has no history of lung cancer. Weight stable, no hemoptysis.   Past Medical History:  Diagnosis Date  . Allergy   . Anxiety   . Arrhythmia   . Asthma   . Blood transfusion without reported diagnosis   . Chicken pox   . Depression   . Diverticulosis 2010   Mild left sided by EGD/Colonoscopy Dr. Alice Reichert  . Eczema   . Endometriosis   . Fatty liver   . GERD (gastroesophageal reflux disease)   . Heart murmur   . Hiatal hernia 2010   "small"  . History of cardiovascular stress test 04/2013   negative per OV note from Dr. Marijo File  . History of echocardiogram 04/2013   normal study; EF 75% per Dr. Marijo File  . History of EKG 2016   NSR with PVC, Dr. Marijo File  . History of Holter monitoring 2014   palpitations/pvc  . Hyperlipidemia   . Hypertension   . Hypokalemia   . Internal  hemorrhoid 2010   small  . Osteoarthritis   . PCR DNA positive for HSV1   . Prediabetes   . Prediabetes   . PVC (premature ventricular contraction) 2014   palpitations  . Thyroid nodule   . Vertigo   . Vitamin D deficiency      Family History  Problem Relation Age of Onset  . Lung cancer Mother   . Heart disease Mother   . Alcohol abuse Father   . Heart disease Father   . Breast cancer Paternal Aunt 68  . Heart disease Maternal Grandmother   . Arthritis Maternal Grandmother   . Arthritis Maternal Grandfather   . Heart disease Maternal Grandfather   . Hearing loss Maternal Grandfather   . Stroke Maternal Grandfather   . Diabetes Paternal Grandmother   . Hearing loss Paternal Grandfather      Social History   Social History  . Marital status: Married    Spouse name: N/A  . Number of children: N/A  . Years of education: N/A   Occupational History  . Not on file.   Social History Main Topics  . Smoking status: Former Smoker    Packs/day: 2.00    Years: 25.00  Types: Cigarettes    Quit date: 06/09/1998  . Smokeless tobacco: Never Used  . Alcohol use 1.2 oz/week    2 Glasses of wine per week  . Drug use: No  . Sexual activity: Yes    Birth control/ protection: None   Other Topics Concern  . Not on file   Social History Narrative   Married, Regina Black. No children.   Mini dachshund (saddie-mae)   HS diploma- Finance.    Former smoker, no etoh or drugs.   Drinks caffeine   Wears seatbelt   Exercises routinely   Smoke detector in the home.    Forearms in the home, locked.    Feels safe in her relationships.      Allergies  Allergen Reactions  . Butorphanol Other (See Comments)    syncope  . Codeine Nausea And Vomiting     Outpatient Medications Prior to Visit  Medication Sig Dispense Refill  . DULoxetine (CYMBALTA) 60 MG capsule Take 1 capsule (60 mg total) by mouth daily. 30 capsule 1  . fluticasone (FLONASE) 50 MCG/ACT nasal spray Place 2 sprays  into both nostrils daily. 16 g 6  . hydrochlorothiazide (HYDRODIURIL) 25 MG tablet Take 25 mg by mouth daily.    Marland Kitchen ibuprofen (ADVIL,MOTRIN) 800 MG tablet Take 800 mg by mouth every 8 (eight) hours as needed.     Marland Kitchen lisinopril (PRINIVIL,ZESTRIL) 40 MG tablet Take 40 mg by mouth daily.    . metoprolol succinate (TOPROL-XL) 25 MG 24 hr tablet Take 3 tablets (75 mg total) by mouth daily. Take with or immediately following a meal. 90 tablet 1  . omeprazole (PRILOSEC) 20 MG capsule Take 20 mg by mouth daily.     Marland Kitchen penciclovir (DENAVIR) 1 % cream Apply topically as needed.     . valACYclovir (VALTREX) 500 MG tablet Take 500 mg by mouth as needed.     . Vitamin D, Ergocalciferol, (DRISDOL) 50000 units CAPS capsule Take 1 capsule (50,000 Units total) by mouth every 7 (seven) days. 12 capsule 0   No facility-administered medications prior to visit.        Review of Systems  Constitutional: Negative for fever and unexpected weight change.  HENT: Positive for postnasal drip. Negative for congestion, dental problem, ear pain, nosebleeds, rhinorrhea, sinus pressure, sneezing, sore throat and trouble swallowing.   Eyes: Negative for redness and itching.  Respiratory: Positive for shortness of breath. Negative for cough, chest tightness and wheezing.   Cardiovascular: Negative for palpitations and leg swelling.  Gastrointestinal: Negative for nausea and vomiting.  Genitourinary: Negative for dysuria.  Musculoskeletal: Negative for joint swelling.  Skin: Negative for rash.  Neurological: Negative for headaches.  Hematological: Does not bruise/bleed easily.  Psychiatric/Behavioral: Negative for dysphoric mood. The patient is not nervous/anxious.        Objective:   Physical Exam Vitals:   02/12/16 1431  BP: 136/68  Pulse: 70  SpO2: 97%  Weight: 177 lb (80.3 kg)  Height: '5\' 3"'$  (1.6 m)    RA  Gen: well appearing, no acute distress HENT: NCAT, OP clear, neck supple without masses Eyes:  PERRL, EOMi Lymph: no cervical lymphadenopathy PULM: CTA B CV: RRR, no mgr, no JVD GI: BS+, soft, nontender, no hsm Derm: no rash or skin breakdown MSK: normal bulk and tone Neuro: A&Ox4, CN II-XII intact, strength 5/5 in all 4 extremities Psyche: normal mood and affect  CBC    Component Value Date/Time   WBC 7.3 12/20/2015 0833   RBC  4.55 12/20/2015 0833   HGB 13.5 12/20/2015 0833   HCT 40.3 12/20/2015 0833   PLT 339.0 12/20/2015 0833   MCV 88.8 12/20/2015 0833   MCHC 33.4 12/20/2015 0833   RDW 14.3 12/20/2015 0833   LYMPHSABS 1.8 12/20/2015 0833   MONOABS 0.3 12/20/2015 0833   EOSABS 0.8 (H) 12/20/2015 0833   BASOSABS 0.2 (H) 12/20/2015 0833         Assessment & Plan:  Multiple pulmonary nodules I have seen the images from her most recent CT chest (August 2017) and discuss them with my partner today in clinic. Quite concerned about the fact that the right upper lobe nodule in particular has grown to 1.5 cm compared to previous images. The fact that this is a groundglass nodule in a heavy smoker is worrisome for potential adenocarcinoma.  The differential diagnosis includes mold disease or some sort of other past infection. That I'm most concerned about the possibility of malignancy. I do not think that a PET scan would be helpful in this situation because of the high false negative rate in the setting of adenocarcinoma.  As the nodule in the right upper lobe seems to be the only one which has grown, I would favor biopsying that one.  Plan: Arrange for navigational biopsy some time later in the month of September 2017 CBC today Plan for BAL culture in addition to surg path and cytology studies for bronchoscopy    Current Outpatient Prescriptions:  .  cholecalciferol (VITAMIN D) 400 units TABS tablet, Take 400 Units by mouth daily., Disp: , Rfl:  .  DULoxetine (CYMBALTA) 60 MG capsule, Take 1 capsule (60 mg total) by mouth daily., Disp: 30 capsule, Rfl: 1 .   fluticasone (FLONASE) 50 MCG/ACT nasal spray, Place 2 sprays into both nostrils daily., Disp: 16 g, Rfl: 6 .  hydrochlorothiazide (HYDRODIURIL) 25 MG tablet, Take 25 mg by mouth daily., Disp: , Rfl:  .  ibuprofen (ADVIL,MOTRIN) 800 MG tablet, Take 800 mg by mouth every 8 (eight) hours as needed. , Disp: , Rfl:  .  lisinopril (PRINIVIL,ZESTRIL) 40 MG tablet, Take 40 mg by mouth daily., Disp: , Rfl:  .  metoprolol succinate (TOPROL-XL) 25 MG 24 hr tablet, Take 3 tablets (75 mg total) by mouth daily. Take with or immediately following a meal., Disp: 90 tablet, Rfl: 1 .  omeprazole (PRILOSEC) 20 MG capsule, Take 20 mg by mouth daily. , Disp: , Rfl:  .  penciclovir (DENAVIR) 1 % cream, Apply topically as needed. , Disp: , Rfl:  .  valACYclovir (VALTREX) 500 MG tablet, Take 500 mg by mouth as needed. , Disp: , Rfl:  .  Vitamin D, Ergocalciferol, (DRISDOL) 50000 units CAPS capsule, Take 1 capsule (50,000 Units total) by mouth every 7 (seven) days., Disp: 12 capsule, Rfl: 0

## 2016-03-05 NOTE — Discharge Instructions (Signed)
Flexible Bronchoscopy, Care After °These instructions give you information on caring for yourself after your procedure. Your doctor may also give you more specific instructions. Call your doctor if you have any problems or questions after your procedure. °HOME CARE °· Do not eat or drink anything for 2 hours after your procedure. If you try to eat or drink before the medicine wears off, food or drink could go into your lungs. You could also burn yourself. °· After 2 hours have passed and when you can cough and gag normally, you may eat soft food and drink liquids slowly. °· The day after the test, you may eat your normal diet. °· You may do your normal activities. °· Keep all doctor visits. °GET HELP RIGHT AWAY IF: °· You get more and more short of breath. °· You get light-headed. °· You feel like you are going to pass out (faint). °· You have chest pain. °· You have new problems that worry you. °· You cough up more than a little blood. °· You cough up more blood than before. °MAKE SURE YOU: °· Understand these instructions. °· Will watch your condition. °· Will get help right away if you are not doing well or get worse. °  °This information is not intended to replace advice given to you by your health care provider. Make sure you discuss any questions you have with your health care provider. ° °Please call our office for any questions or concerns. [phone].  °  °Document Released: 03/23/2009 Document Revised: 05/31/2013 Document Reviewed: 01/28/2013 °Elsevier Interactive Patient Education ©2016 [employer] ° °

## 2016-03-05 NOTE — Anesthesia Preprocedure Evaluation (Addendum)
Anesthesia Evaluation  Patient identified by MRN, date of birth, ID band Patient awake    Reviewed: Allergy & Precautions, NPO status , Patient's Chart, lab work & pertinent test results  History of Anesthesia Complications (+) PONV  Airway Mallampati: II  TM Distance: >3 FB Neck ROM: Full    Dental  (+) Teeth Intact, Dental Advisory Given   Pulmonary asthma , former smoker,    breath sounds clear to auscultation       Cardiovascular hypertension, Pt. on medications and Pt. on home beta blockers + dysrhythmias + Valvular Problems/Murmurs  Rhythm:Regular Rate:Normal     Neuro/Psych  Headaches, Anxiety Depression  Neuromuscular disease    GI/Hepatic hiatal hernia, GERD  ,Hx of fatty liver   Endo/Other    Renal/GU negative Renal ROS     Musculoskeletal  (+) Arthritis ,   Abdominal (+) + obese,   Peds  Hematology negative hematology ROS (+)   Anesthesia Other Findings   Reproductive/Obstetrics                           Anesthesia Physical Anesthesia Plan  ASA: III  Anesthesia Plan: General   Post-op Pain Management:    Induction: Intravenous  Airway Management Planned: Oral ETT  Additional Equipment:   Intra-op Plan:   Post-operative Plan: Extubation in OR  Informed Consent: I have reviewed the patients History and Physical, chart, labs and discussed the procedure including the risks, benefits and alternatives for the proposed anesthesia with the patient or authorized representative who has indicated his/her understanding and acceptance.   Dental advisory given  Plan Discussed with: CRNA, Anesthesiologist and Surgeon  Anesthesia Plan Comments:        Anesthesia Quick Evaluation

## 2016-03-05 NOTE — Anesthesia Procedure Notes (Signed)
Procedure Name: Intubation Date/Time: 03/05/2016 8:39 AM Performed by: Melina Copa, Harsimran Westman R Pre-anesthesia Checklist: Patient identified, Emergency Drugs available, Suction available and Patient being monitored Patient Re-evaluated:Patient Re-evaluated prior to inductionOxygen Delivery Method: Circle System Utilized Preoxygenation: Pre-oxygenation with 100% oxygen Intubation Type: IV induction Ventilation: Mask ventilation without difficulty Laryngoscope Size: Mac and 3 Grade View: Grade II Tube type: Oral Tube size: 9.0 mm Number of attempts: 1 Airway Equipment and Method: Stylet Placement Confirmation: ETT inserted through vocal cords under direct vision,  positive ETCO2 and breath sounds checked- equal and bilateral Secured at: 20 cm Tube secured with: Tape Dental Injury: Teeth and Oropharynx as per pre-operative assessment

## 2016-03-06 ENCOUNTER — Encounter (HOSPITAL_COMMUNITY): Payer: Self-pay | Admitting: Emergency Medicine

## 2016-03-07 ENCOUNTER — Telehealth: Payer: Self-pay | Admitting: Emergency Medicine

## 2016-03-07 LAB — CULTURE, RESPIRATORY W GRAM STAIN
Culture: NO GROWTH
Culture: NO GROWTH

## 2016-03-07 LAB — CULTURE, RESPIRATORY

## 2016-03-07 NOTE — Telephone Encounter (Signed)
Reviewed all results with the patient - the cytology and pathology from both RUL and LUL are negative for malignancy. Stressed to her that this is good news but that we will still need a plan of surveillance of the nodules. She has f/u w Dr Lake Bells on 10/31 to plan this, follow the cx data. Will forward to Dr Adair Laundry

## 2016-03-11 ENCOUNTER — Telehealth: Payer: Self-pay | Admitting: Emergency Medicine

## 2016-03-11 NOTE — Telephone Encounter (Signed)
Pt calling with questions regarding some results that she saw on MyChart Pt is wanting the Body Fluid Cell Count explained. Pt is concerned the results of the "colors".  Pleas advise RB if you are okay explaining this as Dr Lake Bells is not available. Patient is very anxious and wants this explained asap. Thanks.

## 2016-03-11 NOTE — Telephone Encounter (Signed)
Patient called checking on results -pr

## 2016-03-11 NOTE — Telephone Encounter (Signed)
Spoke with pt and she said that she got RB's message and has no further questions. Nothing further needed.

## 2016-03-17 NOTE — Telephone Encounter (Signed)
thanks

## 2016-03-21 DIAGNOSIS — M129 Arthropathy, unspecified: Secondary | ICD-10-CM | POA: Diagnosis not present

## 2016-03-21 DIAGNOSIS — F4320 Adjustment disorder, unspecified: Secondary | ICD-10-CM | POA: Diagnosis not present

## 2016-03-21 DIAGNOSIS — I10 Essential (primary) hypertension: Secondary | ICD-10-CM | POA: Diagnosis not present

## 2016-03-21 DIAGNOSIS — K219 Gastro-esophageal reflux disease without esophagitis: Secondary | ICD-10-CM | POA: Diagnosis not present

## 2016-03-30 ENCOUNTER — Other Ambulatory Visit: Payer: Self-pay | Admitting: Family Medicine

## 2016-04-02 DIAGNOSIS — Z23 Encounter for immunization: Secondary | ICD-10-CM | POA: Diagnosis not present

## 2016-04-02 LAB — FUNGUS CULTURE RESULT

## 2016-04-02 LAB — FUNGUS CULTURE WITH STAIN

## 2016-04-02 LAB — FUNGAL ORGANISM REFLEX

## 2016-04-08 ENCOUNTER — Ambulatory Visit (INDEPENDENT_AMBULATORY_CARE_PROVIDER_SITE_OTHER): Payer: BLUE CROSS/BLUE SHIELD | Admitting: Pulmonary Disease

## 2016-04-08 ENCOUNTER — Encounter: Payer: Self-pay | Admitting: Pulmonary Disease

## 2016-04-08 VITALS — BP 134/74 | HR 84 | Ht 63.0 in | Wt 174.0 lb

## 2016-04-08 DIAGNOSIS — R0789 Other chest pain: Secondary | ICD-10-CM | POA: Diagnosis not present

## 2016-04-08 DIAGNOSIS — R918 Other nonspecific abnormal finding of lung field: Secondary | ICD-10-CM | POA: Diagnosis not present

## 2016-04-08 MED ORDER — ALBUTEROL SULFATE HFA 108 (90 BASE) MCG/ACT IN AERS: 2.0000 | INHALATION_SPRAY | Freq: Four times a day (QID) | RESPIRATORY_TRACT | 6 refills | Status: DC | PRN

## 2016-04-08 NOTE — Addendum Note (Signed)
Addended by: Len Blalock on: 04/08/2016 05:02 PM   Modules accepted: Orders

## 2016-04-08 NOTE — Assessment & Plan Note (Signed)
I'm encouraged by the fact that there is no evidence of malignancy on her pathology reports from the pulmonary nodules which were biopsied during her bronchoscopy. I explained to her today that given the negative pathology reports and negative microbiology reports the most likely explanation for the pulmonary nodules is at there are inflammatory and appear to be causing minimal symptoms. However, considering her prior smoking history we need to continue to follow these.  Plan: Repeat CT chest February 2018

## 2016-04-08 NOTE — Patient Instructions (Signed)
Use albuterol as needed for chest tightness and then let us know if this is helpful when you come back to see [place] the next time We will arrange for a CT scan of your chest in February 2018 and then see you after that

## 2016-04-08 NOTE — Assessment & Plan Note (Addendum)
She has been experiencing chest tightness for several months with a negative cardiac workup. She is a prior smoker so her risk for COPD is increased. Today simple spirometry testing was performed but was poor quality data despite multiple attempts.  We talked about performing full pulmonary function testing versus just watching and waiting. At this time she would prefer to hold off on further pulmonary function testing.  Plan: Repeat spirometry next visit In the meantime I will give her a prescription for albuterol to use as needed for chest tightness

## 2016-04-08 NOTE — Progress Notes (Signed)
Subjective:    Patient ID: Regina Black, female    DOB: 10/21/1955, 60 y.o.   MRN: [id number]  Synopsis: Referred in September 2017 for evaluation of a pulmonary nodule. Underwent navigational bronchoscopy in September 2017 all biopsy results were negative for malignancy, cultures also negative  HPI Chief Complaint  Patient presents with  . Follow-up    pt c/o continual chest heaviness, difficulty taking in a deep breath, lower back pain.     [name] reports that she's been doing well. However, she does have some heaviness in her chest which is been persistent for the last several months. This will come and go and is not associated with cough or shortness of breath. She doesn't have frank chest pain. She attributed it to her heart or high blood pressure but she was evaluated in the emergency room and there is no clear evidence of an acute cardiac problem.  Since the last visit she had a navigational biopsy of the groundglass nodules. No evidence of malignancy.   Past Medical History:  Diagnosis Date  . Allergy   . Anxiety   . Arrhythmia   . Asthma   . Blood transfusion without reported diagnosis   . Chicken pox   . Depression   . Diverticulosis 2010   Mild left sided by EGD/Colonoscopy Dr. Alice Reichert  . Eczema   . Endometriosis   . Fatty liver   . GERD (gastroesophageal reflux disease)   . Headache   . Heart murmur   . Hiatal hernia 2010   "small"  . History of cardiovascular stress test 04/2013   negative per OV note from Dr. Marijo File  . History of echocardiogram 04/2013   normal study; EF 75% per Dr. Marijo File  . History of EKG 2016   NSR with PVC, Dr. Marijo File  . History of Holter monitoring 2014   palpitations/pvc  . Hyperlipidemia   . Hypertension   . Hypokalemia   . Internal hemorrhoid 2010   small  . Osteoarthritis   . PCR DNA positive for HSV1   . PONV (postoperative nausea and vomiting)   . Prediabetes   . Prediabetes   . PVC (premature ventricular  contraction) 2014   palpitations  . Thyroid nodule   . Vertigo   . Vitamin D deficiency       Review of Systems  Constitutional: Negative for chills, fatigue and fever.  HENT: Negative for nosebleeds, postnasal drip and rhinorrhea.   Respiratory: Positive for chest tightness. Negative for shortness of breath and wheezing.   Cardiovascular: Negative for chest pain, palpitations and leg swelling.       Objective:   Physical Exam  Vitals:   04/08/16 1601  BP: 134/74  Pulse: 84  SpO2: 97%  Weight: 174 lb (78.9 kg)  Height: '5\' 3"'$  (1.6 m)   Room air  Gen: well appearing HENT: OP clear, neck supple PULM: CTA B, normal percussion CV: RRR, no mgr, trace edema GI: BS+, soft, nontender Derm: no cyanosis or rash Psyche: normal mood and affect  Results from her bronchoscopy in September 2017 reviewed, no malignancy seen Bronchoscopy operative report reviewed her multiple biopsies were performed in the right and left lung of the groundglass nodules Micro from bronch: negative Simple spirometry from today: Poor quality data     Assessment & Plan:  Multiple pulmonary nodules I'm encouraged by the fact that there is no evidence of malignancy on her pathology reports from the pulmonary nodules which were biopsied during her bronchoscopy. I  explained to her today that given the negative pathology reports and negative microbiology reports the most likely explanation for the pulmonary nodules is at there are inflammatory and appear to be causing minimal symptoms. However, considering her prior smoking history we need to continue to follow these.  Plan: Repeat CT chest February 2018  Chest tightness She has been experiencing chest tightness for several months with a negative cardiac workup. She is a prior smoker so her risk for COPD is increased. Today simple spirometry testing was performed but was poor quality data despite multiple attempts.  We talked about performing full  pulmonary function testing versus just watching and waiting. At this time she would prefer to hold off on further pulmonary function testing.  Plan: Repeat spirometry next visit In the meantime I will give her a prescription for albuterol to use as needed for chest tightness    Current Outpatient Prescriptions:  .  Cholecalciferol (VITAMIN D3) 5000 units TABS, Take 1 tablet by mouth daily. Patient states she also takes 50000 once a week, Disp: , Rfl:  .  DULoxetine (CYMBALTA) 60 MG capsule, TAKE ONE CAPSULE BY MOUTH ONCE DAILY, Disp: 30 capsule, Rfl: 1 .  fluticasone (FLONASE) 50 MCG/ACT nasal spray, Place 2 sprays into both nostrils daily as needed (allergies/congestion)., Disp: , Rfl:  .  hydrochlorothiazide (HYDRODIURIL) 25 MG tablet, Take 25 mg by mouth daily., Disp: , Rfl:  .  ibuprofen (ADVIL,MOTRIN) 800 MG tablet, Take 800 mg by mouth every 8 (eight) hours as needed for mild pain. , Disp: , Rfl:  .  lisinopril (PRINIVIL,ZESTRIL) 40 MG tablet, Take 40 mg by mouth daily., Disp: , Rfl:  .  metoprolol succinate (TOPROL-XL) 25 MG 24 hr tablet, Take 3 tablets (75 mg total) by mouth daily. Take with or immediately following a meal., Disp: 90 tablet, Rfl: 1 .  omeprazole (PRILOSEC) 20 MG capsule, Take 20 mg by mouth daily. , Disp: , Rfl:  .  penciclovir (DENAVIR) 1 % cream, Apply 1 application topically every 4 (four) hours as needed (applied to lip area for recurrent cold sores). , Disp: , Rfl:  .  valACYclovir (VALTREX) 1000 MG tablet, Take 1 g by mouth 2 (two) times daily as needed. For fever blisters/cold sores., Disp: , Rfl: 0 .  Vitamin D, Ergocalciferol, (DRISDOL) 50000 units CAPS capsule, Take 1 capsule (50,000 Units total) by mouth every 7 (seven) days., Disp: 12 capsule, Rfl: 0 .  albuterol (PROVENTIL HFA;VENTOLIN HFA) 108 (90 Base) MCG/ACT inhaler, Inhale 2 puffs into the lungs every 6 (six) hours as needed for wheezing or shortness of breath., Disp: 1 Inhaler, Rfl: 6

## 2016-04-11 ENCOUNTER — Other Ambulatory Visit: Payer: Self-pay | Admitting: *Deleted

## 2016-04-11 DIAGNOSIS — I499 Cardiac arrhythmia, unspecified: Secondary | ICD-10-CM

## 2016-04-11 MED ORDER — METOPROLOL SUCCINATE ER 25 MG PO TB24: 75.0000 mg | ORAL_TABLET | Freq: Every day | ORAL | 0 refills | Status: DC

## 2016-04-17 LAB — ACID FAST CULTURE WITH REFLEXED SENSITIVITIES: ACID FAST CULTURE - AFSCU3: NEGATIVE

## 2016-04-17 LAB — ACID FAST CULTURE WITH REFLEXED SENSITIVITIES (MYCOBACTERIA): Acid Fast Culture: NEGATIVE

## 2016-05-21 DIAGNOSIS — B001 Herpesviral vesicular dermatitis: Secondary | ICD-10-CM | POA: Diagnosis not present

## 2016-05-21 DIAGNOSIS — H578 Other specified disorders of eye and adnexa: Secondary | ICD-10-CM | POA: Diagnosis not present

## 2016-05-23 DIAGNOSIS — Z136 Encounter for screening for cardiovascular disorders: Secondary | ICD-10-CM | POA: Diagnosis not present

## 2016-06-04 DIAGNOSIS — R05 Cough: Secondary | ICD-10-CM | POA: Diagnosis not present

## 2016-06-10 ENCOUNTER — Telehealth: Payer: Self-pay | Admitting: Pulmonary Disease

## 2016-06-10 NOTE — Telephone Encounter (Signed)
Regina Black ° °

## 2016-06-24 ENCOUNTER — Encounter: Payer: Self-pay | Admitting: *Deleted

## 2016-06-24 ENCOUNTER — Other Ambulatory Visit: Payer: Self-pay | Admitting: Family Medicine

## 2016-06-27 ENCOUNTER — Telehealth: Payer: Self-pay | Admitting: Pulmonary Disease

## 2016-06-27 NOTE — Telephone Encounter (Signed)
Spoke with pt, who is curious as to why her CT was ordered w/o contrast instead of with contrast.  BQ please advise. Thanks.

## 2016-06-30 DIAGNOSIS — I10 Essential (primary) hypertension: Secondary | ICD-10-CM | POA: Diagnosis not present

## 2016-06-30 DIAGNOSIS — R002 Palpitations: Secondary | ICD-10-CM | POA: Diagnosis not present

## 2016-06-30 DIAGNOSIS — K219 Gastro-esophageal reflux disease without esophagitis: Secondary | ICD-10-CM | POA: Diagnosis not present

## 2016-07-04 NOTE — Telephone Encounter (Signed)
Long story which I can explain more in clinic with her if she wants.  The short story is that IV contrast increases her risk of a complication with no significant benefit.

## 2016-07-04 NOTE — Telephone Encounter (Signed)
Pt aware of below message and voiced her understanding. Nothing further needed.

## 2016-07-15 DIAGNOSIS — L2089 Other atopic dermatitis: Secondary | ICD-10-CM | POA: Diagnosis not present

## 2016-07-15 DIAGNOSIS — L573 Poikiloderma of Civatte: Secondary | ICD-10-CM | POA: Diagnosis not present

## 2016-07-15 DIAGNOSIS — K130 Diseases of lips: Secondary | ICD-10-CM | POA: Diagnosis not present

## 2016-07-17 ENCOUNTER — Inpatient Hospital Stay: Admission: RE | Admit: 2016-07-17 | Payer: BLUE CROSS/BLUE SHIELD | Source: Ambulatory Visit

## 2016-07-17 DIAGNOSIS — Z6831 Body mass index (BMI) 31.0-31.9, adult: Secondary | ICD-10-CM | POA: Diagnosis not present

## 2016-07-17 DIAGNOSIS — N930 Postcoital and contact bleeding: Secondary | ICD-10-CM | POA: Diagnosis not present

## 2016-07-17 DIAGNOSIS — Z01419 Encounter for gynecological examination (general) (routine) without abnormal findings: Secondary | ICD-10-CM | POA: Diagnosis not present

## 2016-07-18 ENCOUNTER — Ambulatory Visit (INDEPENDENT_AMBULATORY_CARE_PROVIDER_SITE_OTHER)
Admission: RE | Admit: 2016-07-18 | Discharge: 2016-07-18 | Disposition: A | Discharge status: Home / Self Care | Payer: BLUE CROSS/BLUE SHIELD | Source: Ambulatory Visit | Attending: Pulmonary Disease | Admitting: Pulmonary Disease

## 2016-07-18 DIAGNOSIS — R918 Other nonspecific abnormal finding of lung field: Secondary | ICD-10-CM | POA: Diagnosis not present

## 2016-07-25 ENCOUNTER — Encounter: Payer: Self-pay | Admitting: Pulmonary Disease

## 2016-07-25 ENCOUNTER — Ambulatory Visit (INDEPENDENT_AMBULATORY_CARE_PROVIDER_SITE_OTHER): Payer: BLUE CROSS/BLUE SHIELD | Admitting: Pulmonary Disease

## 2016-07-25 DIAGNOSIS — R918 Other nonspecific abnormal finding of lung field: Secondary | ICD-10-CM | POA: Diagnosis not present

## 2016-07-25 DIAGNOSIS — N930 Postcoital and contact bleeding: Secondary | ICD-10-CM | POA: Diagnosis not present

## 2016-07-25 NOTE — Patient Instructions (Signed)
We will arrange for another noncontrast CT scan of your chest in August If the nodules are growing on that study then we will arrange for another biopsy We will see you back in August

## 2016-07-25 NOTE — Assessment & Plan Note (Signed)
At this time we don't have an explanation for her groundglass nodules. However, given her smoking history and her mother's history of lung cancer I think we need to continue to monitor these. Because of very slight interval change in measurement of the right upper lobe nodule as well as a new right upper lobe nodule I would prefer to image these again in 6 months. If there is any evidence of interval increase in size than we need to perform a biopsy again. However, if there is no growth at 6 months then I think we will continue following the radiologist recommendations of continued follow-up through a 5 year period.

## 2016-07-25 NOTE — Addendum Note (Signed)
Addended by: Len Blalock on: 07/25/2016 05:19 PM   Modules accepted: Orders

## 2016-07-25 NOTE — Addendum Note (Signed)
Addended by: Len Blalock on: 07/25/2016 04:32 PM   Modules accepted: Orders

## 2016-07-25 NOTE — Progress Notes (Signed)
Subjective:    Patient ID: Regina Black, female    DOB: 1956/01/01, 61 y.o.   MRN: [id number]  Synopsis: Referred in September 2017 for evaluation of a pulmonary nodule. Underwent navigational bronchoscopy in September 2017 all biopsy results were negative for malignancy, cultures also negative  HPI Chief Complaint  Patient presents with  . Follow-up    wants to discuss CT scan results, no complaints at this time   Regina Black has been going through a lot of stress lately and has dealt with some death in her family lately.   She still has a sensation of chest tightness when she takes a deep breath but she denies cough or dyspnea. She has not been exercising much because of all the family stress. She notes some hoarseness and acid reflux which she attributes to a change in her diet.  Past Medical History:  Diagnosis Date  . Allergy   . Anxiety   . Arrhythmia   . Asthma   . Blood transfusion without reported diagnosis   . Chicken pox   . Depression   . Diverticulosis 2010   Mild left sided by EGD/Colonoscopy Dr. Alice Reichert  . Eczema   . Endometriosis   . Fatty liver   . GERD (gastroesophageal reflux disease)   . Headache   . Heart murmur   . Hiatal hernia 2010   "small"  . History of cardiovascular stress test 04/2013   negative per OV note from Dr. Marijo File  . History of echocardiogram 04/2013   normal study; EF 75% per Dr. Marijo File  . History of EKG 2016   NSR with PVC, Dr. Marijo File  . History of Holter monitoring 2014   palpitations/pvc  . Hyperlipidemia   . Hypertension   . Hypokalemia   . Internal hemorrhoid 2010   small  . Osteoarthritis   . PCR DNA positive for HSV1   . PONV (postoperative nausea and vomiting)   . Prediabetes   . Prediabetes   . PVC (premature ventricular contraction) 2014   palpitations  . Thyroid nodule   . Vertigo   . Vitamin D deficiency       Review of Systems  Constitutional: Negative for chills, fatigue and fever.  HENT: Negative  for nosebleeds, postnasal drip and rhinorrhea.   Respiratory: Positive for chest tightness. Negative for shortness of breath and wheezing.   Cardiovascular: Negative for chest pain, palpitations and leg swelling.       Objective:   Physical Exam  Vitals:   07/25/16 1546  BP: 130/70  Pulse: 69  SpO2: 96%  Weight: 184 lb 3.2 oz (83.6 kg)  Height: '5\' 3"'$  (1.6 m)   Room air  Gen: well appearing HENT: OP clear, TM's clear, neck supple PULM: CTA B, normal percussion CV: RRR, no mgr, trace edema GI: BS+, soft, nontender Derm: no cyanosis or rash Psyche: normal mood and affect   Bronchoscopy report: Bbronchoscopy in September 2017 no malignancy seen Bronchoscopy operative report reviewed her multiple biopsies were performed in the right and left lung of the groundglass nodules Micro from bronch: negative Simple spirometry from today: Poor quality data  February 2018 CT chest images personally reviewed with the patient today in clinic: She has persistent upper lobe groundglass nodules, one on the right has increased slightly in size from 11.4-11.7 mm. There is also a new right upper lobe nodule which is subcentimeter.     Assessment & Plan:  Multiple pulmonary nodules At this time we don't have an explanation  for her groundglass nodules. However, given her smoking history and her mother's history of lung cancer I think we need to continue to monitor these. Because of very slight interval change in measurement of the right upper lobe nodule as well as a new right upper lobe nodule I would prefer to image these again in 6 months. If there is any evidence of interval increase in size than we need to perform a biopsy again. However, if there is no growth at 6 months then I think we will continue following the radiologist recommendations of continued follow-up through a 5 year period.    Current Outpatient Prescriptions:  .  albuterol (PROVENTIL HFA;VENTOLIN HFA) 108 (90 Base) MCG/ACT  inhaler, Inhale 2 puffs into the lungs every 6 (six) hours as needed for wheezing or shortness of breath., Disp: 1 Inhaler, Rfl: 6 .  DULoxetine (CYMBALTA) 60 MG capsule, TAKE ONE CAPSULE BY MOUTH ONCE DAILY, Disp: 30 capsule, Rfl: 0 .  fluticasone (FLONASE) 50 MCG/ACT nasal spray, Place 2 sprays into both nostrils daily as needed (allergies/congestion)., Disp: , Rfl:  .  hydrochlorothiazide (HYDRODIURIL) 25 MG tablet, Take 25 mg by mouth daily., Disp: , Rfl:  .  ibuprofen (ADVIL,MOTRIN) 800 MG tablet, Take 800 mg by mouth every 8 (eight) hours as needed for mild pain. , Disp: , Rfl:  .  lisinopril (PRINIVIL,ZESTRIL) 40 MG tablet, Take 40 mg by mouth daily., Disp: , Rfl:  .  metoprolol succinate (TOPROL-XL) 25 MG 24 hr tablet, Take 3 tablets (75 mg total) by mouth daily. Take with or immediately following a meal., Disp: 90 tablet, Rfl: 0 .  omeprazole (PRILOSEC) 20 MG capsule, Take 20 mg by mouth daily. , Disp: , Rfl:  .  penciclovir (DENAVIR) 1 % cream, Apply 1 application topically every 4 (four) hours as needed (applied to lip area for recurrent cold sores). , Disp: , Rfl:  .  valACYclovir (VALTREX) 1000 MG tablet, Take 1 g by mouth 2 (two) times daily as needed. For fever blisters/cold sores., Disp: , Rfl: 0 .  Vitamin D, Ergocalciferol, (DRISDOL) 50000 units CAPS capsule, Take 1 capsule (50,000 Units total) by mouth every 7 (seven) days., Disp: 12 capsule, Rfl: 0

## 2016-07-29 ENCOUNTER — Other Ambulatory Visit: Payer: Self-pay | Admitting: Family Medicine

## 2016-08-07 DIAGNOSIS — N841 Polyp of cervix uteri: Secondary | ICD-10-CM | POA: Diagnosis not present

## 2016-08-07 DIAGNOSIS — N930 Postcoital and contact bleeding: Secondary | ICD-10-CM | POA: Diagnosis not present

## 2016-08-13 ENCOUNTER — Ambulatory Visit (INDEPENDENT_AMBULATORY_CARE_PROVIDER_SITE_OTHER): Payer: BLUE CROSS/BLUE SHIELD | Admitting: Family Medicine

## 2016-08-13 ENCOUNTER — Encounter: Payer: Self-pay | Admitting: Family Medicine

## 2016-08-13 VITALS — BP 130/84 | HR 54 | Temp 98.3°F | Resp 20 | Wt 182.5 lb

## 2016-08-13 DIAGNOSIS — M542 Cervicalgia: Secondary | ICD-10-CM | POA: Diagnosis not present

## 2016-08-13 DIAGNOSIS — F418 Other specified anxiety disorders: Secondary | ICD-10-CM | POA: Diagnosis not present

## 2016-08-13 MED ORDER — DULOXETINE HCL 60 MG PO CPEP: 60.0000 mg | ORAL_CAPSULE | Freq: Every day | ORAL | 1 refills | Status: DC

## 2016-08-13 NOTE — Progress Notes (Signed)
Regina Black , May 15, 1956, 61 y.o., female MRN: [id number] Patient Care Team    Relationship Specialty Notifications Start End  [name], DO PCP - General Family Medicine  11/22/15   Revonda Standard. Rohrbeck, MD  Cardiology  11/22/15   Luellen Pucker, MD  Obstetrics and Gynecology  12/10/15   Olean Ree, MD Referring Physician Internal Medicine  12/27/15     CC: Neck and shoulder pain Subjective: Pt presents for an acute OV with complaints of neck and shoulder pain of one-month duration.  Associated symptoms include neck tightness. She states she has "been through a lot" since she was here last. She has had multiple family members pass away over the last few months. She states that she may have neck and shoulder discomfort secondary to her stress. She reports tension in her neck that extends to her bilateral shoulders, left worse than right. She has tried nothing to improve the pain. She denies injury, changes in the bedding/bed etc. She denies any increase in activity or lifting.  Depression with anxiety: Patient states she needs refills on her Cymbalta 60 mg daily. She reports compliance with this medication daily. She feels it has been helpful with her depression and anxiety. She is going through a lot of stress currently, but does not feel she needs additional medication.  Depression screen [facility] 2/9 12/20/2015 11/22/2015  Decreased Interest 0 0  Down, Depressed, Hopeless 0 0  PHQ - 2 Score 0 0    Allergies  Allergen Reactions  . Butorphanol Other (See Comments)    syncope  . Codeine Nausea And Vomiting   Social History  Substance Use Topics  . Smoking status: Former Smoker    Packs/day: 2.00    Years: 25.00    Types: Cigarettes    Quit date: 06/09/1998  . Smokeless tobacco: Never Used  . Alcohol use 1.2 oz/week    2 Glasses of wine per week   Past Medical History:  Diagnosis Date  . Allergy   . Anxiety   . Arrhythmia   . Asthma   . Blood transfusion without  reported diagnosis   . Chicken pox   . Depression   . Diverticulosis 2010   Mild left sided by EGD/Colonoscopy Dr. Alice Reichert  . Eczema   . Endometriosis   . Fatty liver   . GERD (gastroesophageal reflux disease)   . Headache   . Heart murmur   . Hiatal hernia 2010   "small"  . History of cardiovascular stress test 04/2013   negative per OV note from Dr. Marijo File  . History of echocardiogram 04/2013   normal study; EF 75% per Dr. Marijo File  . History of EKG 2016   NSR with PVC, Dr. Marijo File  . History of Holter monitoring 2014   palpitations/pvc  . Hyperlipidemia   . Hypertension   . Hypokalemia   . Internal hemorrhoid 2010   small  . Osteoarthritis   . PCR DNA positive for HSV1   . PONV (postoperative nausea and vomiting)   . Prediabetes   . Prediabetes   . PVC (premature ventricular contraction) 2014   palpitations  . Thyroid nodule   . Vertigo   . Vitamin D deficiency    Past Surgical History:  Procedure Laterality Date  . BREAST SURGERY     reduction  . COLONOSCOPY  2010  . ESOPHAGOGASTRODUODENOSCOPY  2010  . EXPLORATORY LAPAROTOMY     endometriosis  . LASIK    . RECTAL SURGERY    .  RECTOCELE REPAIR  07/2014  . thyroid nodule asp  01/11/2013  . TONSILLECTOMY AND ADENOIDECTOMY    . TUBAL LIGATION    . VIDEO BRONCHOSCOPY WITH ENDOBRONCHIAL NAVIGATION N/A 03/05/2016   Procedure: VIDEO BRONCHOSCOPY WITH ENDOBRONCHIAL NAVIGATION;  Surgeon: Collene Gobble, MD;  Location: [facility] OR;  Service: Thoracic;  Laterality: N/A;   Family History  Problem Relation Age of Onset  . Lung cancer Mother   . Heart disease Mother   . Alcohol abuse Father   . Heart disease Father   . Breast cancer Paternal Aunt 75  . Heart disease Maternal Grandmother   . Arthritis Maternal Grandmother   . Arthritis Maternal Grandfather   . Heart disease Maternal Grandfather   . Hearing loss Maternal Grandfather   . Stroke Maternal Grandfather   . Diabetes Paternal Grandmother   . Hearing loss  Paternal Grandfather    Allergies as of 08/13/2016      Reactions   Butorphanol Other (See Comments)   syncope   Codeine Nausea And Vomiting      Medication List       Accurate as of 08/13/16  9:45 AM. Always use your most recent med list.          albuterol 108 (90 Base) MCG/ACT inhaler Commonly known as:  PROVENTIL HFA;VENTOLIN HFA Inhale 2 puffs into the lungs every 6 (six) hours as needed for wheezing or shortness of breath.   DULoxetine 60 MG capsule Commonly known as:  CYMBALTA Take 1 capsule (60 mg total) by mouth daily. Needs office visit prior to anymore refills   fluticasone 50 MCG/ACT nasal spray Commonly known as:  FLONASE Place 2 sprays into both nostrils daily as needed (allergies/congestion).   hydrochlorothiazide 25 MG tablet Commonly known as:  HYDRODIURIL Take 25 mg by mouth daily.   ibuprofen 800 MG tablet Commonly known as:  ADVIL,MOTRIN Take 800 mg by mouth every 8 (eight) hours as needed for mild pain.   lisinopril 40 MG tablet Commonly known as:  PRINIVIL,ZESTRIL Take 40 mg by mouth daily.   metoprolol succinate 25 MG 24 hr tablet Commonly known as:  TOPROL-XL Take 3 tablets (75 mg total) by mouth daily. Take with or immediately following a meal.   omeprazole 20 MG capsule Commonly known as:  PRILOSEC Take 20 mg by mouth daily.   penciclovir 1 % cream Commonly known as:  DENAVIR Apply 1 application topically every 4 (four) hours as needed (applied to lip area for recurrent cold sores).   valACYclovir 1000 MG tablet Commonly known as:  VALTREX Take 1 g by mouth 2 (two) times daily as needed. For fever blisters/cold sores.   VITAMIN B50 COMPLEX PO Take by mouth.       No results found for this or any previous visit (from the past 24 hour(s)). No results found.  ROS: Negative, with the exception of above mentioned in HPI  Objective:  BP 130/84 (BP Location: Left Arm, Patient Position: Sitting, Cuff Size: Normal)   Pulse (!) 54    Temp 98.3 F (36.8 C)   Resp 20   Wt 182 lb 8 oz (82.8 kg)   SpO2 97%   BMI 32.33 kg/m  Body mass index is 32.33 kg/m. Gen: Afebrile. No acute distress. Nontoxic in appearance, well developed, well nourished.  HENT: AT. [place].MMM MSK/neuro: No erythema, no soft tissue swelling. Bilateral lower trapezium muscle ropiness. No cervical spine or thoracic spine bone tenderness. Full range of motion bilateral arms and cervical spine.  Negative empty can test, negative [place]. Muscle strength 5/5 bilateral upper extremity. Neurovascularly intact distally. Psych: Normal affect, dress and demeanor. Normal speech. Normal thought content and judgment.  Assessment/Plan: Regina Black is a 61 y.o. female present for acute OV for  Depression with anxiety - Stable today. Patient encouraged to make appointment every 6 months to follow-up on depression and anxiety.  - Refills on Cymbalta 60 mg daily for 6 months. Neck pain - New - Discussed with patient this appears to be tension related. Would encourage her to use massage and heat therapy. - Prescribed Flexeril daily at bedtime when necessary, with sedation caution. - Handwritten prescription provided for massage therapy once every 2 weeks 3 treatments. - Range of motion neck stretches provided to patient today. - Follow-up in 4 weeks if not resolved, sooner if worsening.  Reviewed expectations re: course of current medical issues.  Discussed self-management of symptoms.  Outlined signs and symptoms indicating need for more acute intervention.  Patient verbalized understanding and all questions were answered.  Patient received an After-Visit Summary.   electronically signed by:  Howard Pouch, DO  [place]

## 2016-08-13 NOTE — Patient Instructions (Addendum)
Flexeril is a muscle relaxer and start using at night, caution with sedation.  If able you can take it one additional time during the day.  Start range of motion neck stretches.  Use heat therapy.   I have refilled your Cymbalta for 6 months f/u in 6 months on depression/anxiety.  New problem need to be addressed on their own appointment in the future.

## 2016-08-15 ENCOUNTER — Telehealth: Payer: Self-pay | Admitting: Family Medicine

## 2016-08-15 ENCOUNTER — Other Ambulatory Visit: Payer: Self-pay | Admitting: Family Medicine

## 2016-08-15 MED ORDER — CYCLOBENZAPRINE HCL 5 MG PO TABS: 5.0000 mg | ORAL_TABLET | Freq: Three times a day (TID) | ORAL | 1 refills | Status: DC | PRN

## 2016-08-15 NOTE — Progress Notes (Signed)
Flexeril prescribed, lese apologize to pt, it appears I did not send the script after her appt.

## 2016-08-15 NOTE — Telephone Encounter (Signed)
Patient notified and verbalized understanding. 

## 2016-08-15 NOTE — Telephone Encounter (Signed)
Please advise 

## 2016-08-15 NOTE — Telephone Encounter (Signed)
Patient states that she was supposed to get a muscle relaxer at her visit, but nothing was written., please give her a call to update. Verified cell #

## 2016-08-15 NOTE — Telephone Encounter (Signed)
Sent script, please apologize for me, I must have forgotten to hit send. Script entered.

## 2016-08-28 DIAGNOSIS — J069 Acute upper respiratory infection, unspecified: Secondary | ICD-10-CM | POA: Diagnosis not present

## 2016-09-08 DIAGNOSIS — E119 Type 2 diabetes mellitus without complications: Secondary | ICD-10-CM | POA: Diagnosis not present

## 2016-09-26 DIAGNOSIS — Z889 Allergy status to unspecified drugs, medicaments and biological substances status: Secondary | ICD-10-CM | POA: Diagnosis not present

## 2016-09-26 DIAGNOSIS — M129 Arthropathy, unspecified: Secondary | ICD-10-CM | POA: Diagnosis not present

## 2016-09-26 DIAGNOSIS — K219 Gastro-esophageal reflux disease without esophagitis: Secondary | ICD-10-CM | POA: Diagnosis not present

## 2016-09-26 DIAGNOSIS — I10 Essential (primary) hypertension: Secondary | ICD-10-CM | POA: Diagnosis not present

## 2016-09-27 LAB — LIPID PANEL
Cholesterol: 238 mg/dL — AB (ref 0–200)
HDL: 63 mg/dL (ref 35–70)
LDL Cholesterol: 154 mg/dL
LDL/HDL RATIO: 2.4
Triglycerides: 107 mg/dL (ref 40–160)

## 2016-09-27 LAB — TSH: TSH: 0.39 u[IU]/mL — AB (ref <=5.90)

## 2016-09-27 LAB — BASIC METABOLIC PANEL
BUN: 21 mg/dL (ref 4–21)
CREATININE: 0.8 mg/dL (ref <=1.1)
Glucose: 104 mg/dL
Potassium: 4.4 mmol/L (ref 3.4–5.3)
SODIUM: 140 mmol/L (ref 137–147)

## 2016-09-27 LAB — CBC AND DIFFERENTIAL
HCT: 41 % (ref 36–46)
Hemoglobin: 13.5 g/dL (ref 12.0–16.0)
NEUTROS ABS: 3 /uL
Platelets: 328 10*3/uL (ref 150–399)
WBC: 5.3 10^3/mL

## 2016-09-27 LAB — HEPATIC FUNCTION PANEL
ALT: 30 U/L (ref 7–35)
AST: 25 U/L (ref 13–35)
Alkaline Phosphatase: 103 U/L (ref 25–125)
BILIRUBIN DIRECT: 0.3 mg/dL

## 2016-10-01 ENCOUNTER — Encounter: Payer: Self-pay | Admitting: *Deleted

## 2016-10-02 ENCOUNTER — Telehealth: Payer: Self-pay | Admitting: Family Medicine

## 2016-10-02 DIAGNOSIS — R7989 Other specified abnormal findings of blood chemistry: Secondary | ICD-10-CM

## 2016-10-02 DIAGNOSIS — E042 Nontoxic multinodular goiter: Secondary | ICD-10-CM

## 2016-10-02 NOTE — Telephone Encounter (Signed)
Spoke with patient reviewed information and instructions. Answered all questions patient will call back to schedule her appts after she gets scheduled for her [place].

## 2016-10-02 NOTE — Telephone Encounter (Signed)
Pt dropped off labs collected at another location (work). They have been entered into the EMR today.  - she is asking if she needs to make an appointment to discuss labs collected at work.  - her cholesterol was higher than desired and her thyroid function was abnormal. She should make an appt to discuss these issues.  - she also due for her thyroid US repeat this month. - I would suggest she make an appt in about 4 weeks to to have labs repeated on her thyroid 2 days prior to provider appt and her repeat US completed as well,  so we can discuss all results. - all orders placed.

## 2016-10-08 DIAGNOSIS — H1045 Other chronic allergic conjunctivitis: Secondary | ICD-10-CM | POA: Diagnosis not present

## 2016-10-08 DIAGNOSIS — R21 Rash and other nonspecific skin eruption: Secondary | ICD-10-CM | POA: Diagnosis not present

## 2016-10-08 DIAGNOSIS — R05 Cough: Secondary | ICD-10-CM | POA: Diagnosis not present

## 2016-10-08 DIAGNOSIS — J309 Allergic rhinitis, unspecified: Secondary | ICD-10-CM | POA: Diagnosis not present

## 2016-10-10 ENCOUNTER — Ambulatory Visit
Admission: RE | Admit: 2016-10-10 | Discharge: 2016-10-10 | Disposition: A | Discharge status: Home / Self Care | Payer: BLUE CROSS/BLUE SHIELD | Source: Ambulatory Visit | Attending: Family Medicine | Admitting: Family Medicine

## 2016-10-10 ENCOUNTER — Telehealth: Payer: Self-pay | Admitting: Family Medicine

## 2016-10-10 DIAGNOSIS — E042 Nontoxic multinodular goiter: Secondary | ICD-10-CM

## 2016-10-10 DIAGNOSIS — E041 Nontoxic single thyroid nodule: Secondary | ICD-10-CM | POA: Diagnosis not present

## 2016-10-10 DIAGNOSIS — R7989 Other specified abnormal findings of blood chemistry: Secondary | ICD-10-CM

## 2016-10-10 NOTE — Telephone Encounter (Signed)
Thyroid US completed, pt has appt next week to review.

## 2016-10-15 ENCOUNTER — Telehealth: Payer: Self-pay | Admitting: Family Medicine

## 2016-10-15 NOTE — Telephone Encounter (Signed)
Left message for patient to return call.

## 2016-10-15 NOTE — Telephone Encounter (Signed)
Spoke with patient reviewed information. Patient verbalized understanding 

## 2016-10-15 NOTE — Telephone Encounter (Signed)
Patient was suppose to follow up after her [place] to discuss results. When I received the results, she had an appt scheduled to review them so she was not called for that reason. That appointment appears to have been canceled and rescheduled in June after her repeat labs. -  Therefore, I will review all of her results in  person with her in June in detail. Briefly, the results are stable and there are no concerns.    Documentation only: Ultrasound was completed pre-appointment for yearly follow-up on thyroid nodules. Labs ordered based on abnormal labs collected at work and dropped off to [place], patient was not seen here for these issues. She will need to Office visit concerning these matters and discuss results.

## 2016-10-15 NOTE — Telephone Encounter (Signed)
Pt would like a call with her [place] results from 10/09/16 please. She is at work today and can be reached best at that number.

## 2016-11-05 ENCOUNTER — Other Ambulatory Visit (INDEPENDENT_AMBULATORY_CARE_PROVIDER_SITE_OTHER): Payer: BLUE CROSS/BLUE SHIELD

## 2016-11-05 DIAGNOSIS — E042 Nontoxic multinodular goiter: Secondary | ICD-10-CM | POA: Diagnosis not present

## 2016-11-05 DIAGNOSIS — R946 Abnormal results of thyroid function studies: Secondary | ICD-10-CM | POA: Diagnosis not present

## 2016-11-05 DIAGNOSIS — R7989 Other specified abnormal findings of blood chemistry: Secondary | ICD-10-CM

## 2016-11-05 LAB — T4, FREE: FREE T4: 0.9 ng/dL (ref 0.8–1.8)

## 2016-11-05 LAB — TSH: TSH: 0.58 m[IU]/L

## 2016-11-05 LAB — T3, FREE: T3 FREE: 3 pg/mL (ref 2.3–4.2)

## 2016-11-07 ENCOUNTER — Ambulatory Visit (INDEPENDENT_AMBULATORY_CARE_PROVIDER_SITE_OTHER): Payer: BLUE CROSS/BLUE SHIELD | Admitting: Family Medicine

## 2016-11-07 ENCOUNTER — Encounter: Payer: Self-pay | Admitting: Family Medicine

## 2016-11-07 VITALS — BP 140/92 | HR 62 | Temp 98.2°F | Resp 20 | Ht 63.0 in | Wt 188.2 lb

## 2016-11-07 DIAGNOSIS — R946 Abnormal results of thyroid function studies: Secondary | ICD-10-CM | POA: Diagnosis not present

## 2016-11-07 DIAGNOSIS — E041 Nontoxic single thyroid nodule: Secondary | ICD-10-CM

## 2016-11-07 DIAGNOSIS — E785 Hyperlipidemia, unspecified: Secondary | ICD-10-CM

## 2016-11-07 DIAGNOSIS — R7989 Other specified abnormal findings of blood chemistry: Secondary | ICD-10-CM

## 2016-11-07 MED ORDER — SIMVASTATIN 20 MG PO TABS: 20.0000 mg | ORAL_TABLET | Freq: Every day | ORAL | 1 refills | Status: DC

## 2016-11-07 NOTE — Patient Instructions (Signed)
Your thyroid ultrasound is normal. Your thyroid panel is normal.  Cholesterol is higher than desired, with your history and fhx I do recommend a statin, so we will start the zocor every night.  You can use the CoQ10 if needed to avoid the potential of muscle aches.   We will retest your lipids on next visit.    Exercise > 150 minutes a week, low sugar/saturated fats.   It was nice to see you today. I am so sorry you have lost so many loved ones lately. Hang In there and if you need anything let us know.    Please help [place] help you:  We are honored you have chosen [facility] for your Primary Care home. Below you will find basic instructions that you may need to access in the future. Please help [place] help you by reading the instructions, which cover many of the frequent questions we experience.   Prescription refills and request:  -In order to allow more efficient response time, please call your pharmacy for all refills. They will forward the request electronically to [place]. This allows for the quickest possible response. Request left on a nurse line can take longer to refill, since these are checked as time allows between office patients and other phone calls.  - refill request can take up to 3-5 working days to complete.  - If request is sent electronically and request is appropiate, it is usually completed in 1-2 business days.  - all patients will need to be seen routinely for all chronic medical conditions requiring prescription medications (see follow-up below). If you are overdue for follow up on your condition, you will be asked to make an appointment and we will call in enough medication to cover you until your appointment (up to 30 days).  - all controlled substances will require a face to face visit to request/refill.  - if you desire your prescriptions to go through a new pharmacy, and have an active script at original pharmacy, you will need to call your pharmacy and have scripts  transferred to new pharmacy. This is completed between the pharmacy locations and not by your provider.    Results: If any images or labs were ordered, it can take up to 1 week to get results depending on the test ordered and the lab/facility running and resulting the test. - Normal or stable results, which do not need further discussion, may be released to your mychart immediately with attached note to you. A call may not be generated for normal results. Please make certain to sign up for mychart. If you have questions on how to activate your mychart you can call the front office.  - If your results need further discussion, our office will attempt to contact you via phone, and if unable to reach you after 2 attempts, we will release your abnormal result to your mychart with instructions.  - All results will be automatically released in mychart after 1 week.  - Your provider will provide you with explanation and instruction on all relevant material in your results. Please keep in mind, results and labs may appear confusing or abnormal to the untrained eye, but it does not mean they are actually abnormal for you personally. If you have any questions about your results that are not covered, or you desire more detailed explanation than what was provided, you should make an appointment with your provider to do so.   Our office handles many outgoing and incoming calls  daily. If we have not contacted you within 1 week about your results, please check your mychart to see if there is a message first and if not, then contact our office.  In helping with this matter, you help decrease call volume, and therefore allow [place] to be able to respond to patients needs more efficiently.   Acute office visits (sick visit):  An acute visit is intended for a new problem and are scheduled in shorter time slots to allow schedule openings for patients with new problems. This is the appropriate visit to discuss a new problem. In  order to provide you with excellent quality medical care with proper time for you to explain your problem, have an exam and receive treatment with instructions, these appointments should be limited to one new problem per visit. If you experience a new problem, in which you desire to be addressed, please make an acute office visit, we save openings on the schedule to accommodate you. Please do not save your new problem for any other type of visit, let us take care of it properly and quickly for you.   Follow up visits:  Depending on your condition(s) your provider will need to see you routinely in order to provide you with quality care and prescribe medication(s). Most chronic conditions (Example: hypertension, Diabetes, depression/anxiety... etc), require visits a couple times a year. Your provider will instruct you on proper follow up for your personal medical conditions and history. Please make certain to make follow up appointments for your condition as instructed. Failing to do so could result in lapse in your medication treatment/refills. If you request a refill, and are overdue to be seen on a condition, we will always provide you with a 30 day script (once) to allow you time to schedule.    Medicare wellness (well visit): - we have a wonderful Nurse Maudie Mercury), that will meet with you and provide you will yearly medicare wellness visits. These visits should occur yearly (can not be scheduled less than 1 calendar year apart) and cover preventive health, immunizations, advance directives and screenings you are entitled to yearly through your medicare benefits. Do not miss out on your entitled benefits, this is when medicare will pay for these benefits to be ordered for you.  These are strongly encouraged by your provider and is the appropriate type of visit to make certain you are up to date with all preventive health benefits. If you have not had your medicare wellness exam in the last 12 months, please make  certain to schedule one by calling the office and schedule your medicare wellness with Maudie Mercury as soon as possible.   Yearly physical (well visit):  - Adults are recommended to be seen yearly for physicals. Check with your insurance and date of your last physical, most insurances require one calendar year between physicals. Physicals include all preventive health topics, screenings, medical exam and labs that are appropriate for gender/age and history. You may have fasting labs needed at this visit. This is a well visit (not a sick visit), new problems should not be covered during this visit (see acute visit).  - Pediatric patients are seen more frequently when they are younger. Your provider will advise you on well child visit timing that is appropriate for your their age. - This is not a medicare wellness visit. Medicare wellness exams do not have an exam portion to the visit. Some medicare companies allow for a physical, some do not allow a yearly physical. If  your medicare allows a yearly physical you can schedule the medicare wellness with our nurse Maudie Mercury and have your physical with your provider after, on the same day. Please check with insurance for your full benefits.   Late Policy/No Shows:  - all new patients should arrive 15-30 minutes earlier than appointment to allow [place] time  to  obtain all personal demographics,  insurance information and for you to complete office paperwork. - All established patients should arrive 10-15 minutes earlier than appointment time to update all information and be checked in .  - In our best efforts to run on time, if you are late for your appointment you will be asked to either reschedule or if able, we will work you back into the schedule. There will be a wait time to work you back in the schedule,  depending on availability.  - If you are unable to make it to your appointment as scheduled, please call 24 hours ahead of time to allow [place] to fill the time slot with  someone else who needs to be seen. If you do not cancel your appointment ahead of time, you may be charged a no show fee.

## 2016-11-07 NOTE — Progress Notes (Signed)
Regina Black , July 22, 1955, 61 y.o., female MRN: [id number] Patient Care Team    Relationship Specialty Notifications Start End  [name], DO PCP - General Family Medicine  11/22/15   Denton Brick., MD  Cardiology  11/22/15   Luellen Pucker, MD  Obstetrics and Gynecology  12/10/15   Olean Ree, MD Referring Physician Internal Medicine  12/27/15   Juanito Doom, MD Consulting Physician Pulmonary Disease  10/02/16    Comment: pulm nodules    Chief Complaint  Patient presents with  . Results    lab and [place]     Subjective:   Thyroid nodule: Pt presents for an OV to discuss labs collected at an outside /work related setting, which resulted with abnormal thyroid levels. Pt has a known h/o thyroid nodules that had been followed by endocrine in the past and had remained stable. Since abnl labs at work she has had her thyroid panel and thyroid US repeated. Pt dropped off labs collected at another location (work). They have been entered into the EMR.   Hyperlipidemia: Her cholesterol was also higher than desired at collection at work.  She does have a family h/o heart disease. She is not on a statin.   US Thyroid Result Date: 10/10/2016 CLINICAL DATA:  61 year old female with abnormal TSH and left thyroid nodule which was previously biopsied in August of 2014 EXAM: THYROID ULTRASOUND TECHNIQUE: Ultrasound examination of the thyroid gland and adjacent soft tissues was performed. COMPARISON:  Most recent prior thyroid ultrasound 08/21/2015; more remote prior thyroid ultrasound 11/18/2012; biopsy images 8 10/2012 FINDINGS: Parenchymal Echotexture: Markedly heterogenous Isthmus: 0.2 cm Right lobe: 4.1 x 1.5 x 1.6 cm Left lobe: 5.0 x 2.1 x 2.2 cm _________________________________________________________ Estimated total number of nodules >/= 1 cm: 1 Number of spongiform nodules >/=  2 cm not described below (TR1): 0 Number of mixed cystic and solid nodules >/= 1.5 cm not described  below (TR2): 0 _________________________________________________________ The previously biopsied heterogeneous predominantly solid nodule with internal dystrophic calcification in the left mid and lower gland again measures 3.2 x 1.4 x 1.8 cm which is unchanged compared to the most recent prior examination and decreased in size compared to 3.7 x 2.1 x 2.1 cm reported in June of 2014. IMPRESSION: Continued stability of previously biopsied left thyroid nodule. In fact, the nodule has slightly involuted over time compared to June of 2014. Findings are consistent with benignity. Electronically Signed   By: Jacqulynn Cadet M.D.   On: 10/10/2016 16:02    Results for orders placed or performed in visit on 11/05/16 (from the past 72 hour(s))  TSH     Status: None   Collection Time: 11/05/16  8:05 AM  Result Value Ref Range   TSH 0.58 mIU/L    Comment:   Reference Range   > or = 20 Years  0.40-4.50   Pregnancy Range First trimester  0.26-2.66 Second trimester 0.55-2.73 Third trimester  0.43-2.91     T3, free     Status: None   Collection Time: 11/05/16  8:05 AM  Result Value Ref Range   T3, Free 3.0 2.3 - 4.2 pg/mL  T4, free     Status: None   Collection Time: 11/05/16  8:05 AM  Result Value Ref Range   Free T4 0.9 0.8 - 1.8 ng/dL   Lipid Panel     Component Value Date/Time   CHOL 238 (A) 09/27/2016   TRIG 107 09/27/2016   HDL 63  09/27/2016   LDLCALC 154 09/27/2016     Depression screen PHQ 2/9 11/07/2016 12/20/2015 11/22/2015  Decreased Interest 0 0 0  Down, Depressed, Hopeless 0 0 0  PHQ - 2 Score 0 0 0    Allergies  Allergen Reactions  . Butorphanol Other (See Comments)    syncope  . Codeine Nausea And Vomiting   Social History  Substance Use Topics  . Smoking status: Former Smoker    Packs/day: 2.00    Years: 25.00    Types: Cigarettes    Quit date: 06/09/1998  . Smokeless tobacco: Never Used  . Alcohol use 1.2 oz/week    2 Glasses of wine per week   Past Medical  History:  Diagnosis Date  . Allergy   . Anxiety   . Arrhythmia   . Asthma   . Blood transfusion without reported diagnosis   . Chicken pox   . Depression   . Diverticulosis 2010   Mild left sided by EGD/Colonoscopy Dr. Alice Reichert  . Eczema   . Endometriosis   . Fatty liver   . GERD (gastroesophageal reflux disease)   . Headache   . Heart murmur   . Hiatal hernia 2010   "small"  . History of cardiovascular stress test 04/2013   negative per OV note from Dr. Marijo File  . History of echocardiogram 04/2013   normal study; EF 75% per Dr. Marijo File  . History of EKG 2016   NSR with PVC, Dr. Marijo File  . History of Holter monitoring 2014   palpitations/pvc  . Hyperlipidemia   . Hypertension   . Hypokalemia   . Internal hemorrhoid 2010   small  . Osteoarthritis   . PCR DNA positive for HSV1   . PONV (postoperative nausea and vomiting)   . Prediabetes   . Prediabetes   . PVC (premature ventricular contraction) 2014   palpitations  . Thyroid nodule   . Vertigo   . Vitamin D deficiency    Past Surgical History:  Procedure Laterality Date  . BREAST SURGERY     reduction  . COLONOSCOPY  2010  . ESOPHAGOGASTRODUODENOSCOPY  2010  . EXPLORATORY LAPAROTOMY     endometriosis  . LASIK    . RECTAL SURGERY    . RECTOCELE REPAIR  07/2014  . thyroid nodule asp  01/11/2013  . TONSILLECTOMY AND ADENOIDECTOMY    . TUBAL LIGATION    . VIDEO BRONCHOSCOPY WITH ENDOBRONCHIAL NAVIGATION N/A 03/05/2016   Procedure: VIDEO BRONCHOSCOPY WITH ENDOBRONCHIAL NAVIGATION;  Surgeon: Collene Gobble, MD;  Location: [facility] OR;  Service: Thoracic;  Laterality: N/A;   Family History  Problem Relation Age of Onset  . Lung cancer Mother   . Heart disease Mother   . Alcohol abuse Father   . Heart disease Father   . Breast cancer Paternal Aunt 38  . Heart disease Maternal Grandmother   . Arthritis Maternal Grandmother   . Arthritis Maternal Grandfather   . Heart disease Maternal Grandfather   . Hearing loss  Maternal Grandfather   . Stroke Maternal Grandfather   . Diabetes Paternal Grandmother   . Hearing loss Paternal Grandfather    Allergies as of 11/07/2016      Reactions   Butorphanol Other (See Comments)   syncope   Codeine Nausea And Vomiting      Medication List       Accurate as of 11/07/16  8:19 AM. Always use your most recent med list.  albuterol 108 (90 Base) MCG/ACT inhaler Commonly known as:  PROVENTIL HFA;VENTOLIN HFA Inhale 2 puffs into the lungs every 6 (six) hours as needed for wheezing or shortness of breath.   cyclobenzaprine 5 MG tablet Commonly known as:  FLEXERIL Take 1 tablet (5 mg total) by mouth 3 (three) times daily as needed for muscle spasms.   DULoxetine 60 MG capsule Commonly known as:  CYMBALTA Take 1 capsule (60 mg total) by mouth daily. Needs office visit prior to anymore refills   fluticasone 50 MCG/ACT nasal spray Commonly known as:  FLONASE Place 2 sprays into both nostrils daily as needed (allergies/congestion).   hydrochlorothiazide 25 MG tablet Commonly known as:  HYDRODIURIL Take 25 mg by mouth daily.   ibuprofen 800 MG tablet Commonly known as:  ADVIL,MOTRIN Take 800 mg by mouth every 8 (eight) hours as needed for mild pain.   lisinopril 40 MG tablet Commonly known as:  PRINIVIL,ZESTRIL Take 40 mg by mouth daily.   metoprolol succinate 25 MG 24 hr tablet Commonly known as:  TOPROL-XL Take 3 tablets (75 mg total) by mouth daily. Take with or immediately following a meal.   omeprazole 20 MG capsule Commonly known as:  PRILOSEC Take 20 mg by mouth daily.   penciclovir 1 % cream Commonly known as:  DENAVIR Apply 1 application topically every 4 (four) hours as needed (applied to lip area for recurrent cold sores).   valACYclovir 1000 MG tablet Commonly known as:  VALTREX Take 1 g by mouth 2 (two) times daily as needed. For fever blisters/cold sores.   VITAMIN B50 COMPLEX PO Take by mouth.       All past medical  history, surgical history, allergies, family history, immunizations andmedications were updated in the EMR today and reviewed under the history and medication portions of their EMR.     ROS: Negative, with the exception of above mentioned in HPI   Objective:  BP (!) 140/92 (BP Location: Right Arm, Patient Position: Sitting, Cuff Size: Large)   Pulse 62   Temp 98.2 F (36.8 C)   Resp 20   Ht 5\' 3"  (1.6 m)   Wt 188 lb 4 oz (85.4 kg)   SpO2 95%   BMI 33.35 kg/m  Body mass index is 33.35 kg/m. Gen: Afebrile. No acute distress. Nontoxic in appearance, well developed, well nourished.  HENT: AT. [address]. MMM Eyes:Pupils Equal Round Reactive to light, Extraocular movements intact,  Conjunctiva without redness, discharge or icterus. Neck/lymp/endocrine: Supple, no lymphadenopathy, no thyromegaly CV: RRR  Chest: CTAB, no wheeze or crackles. Good air movement, normal resp effort.  Neuro:  Normal gait. PERLA. EOMi. Alert. Oriented x3  No exam data present No results found. No results found for this or any previous visit (from the past 24 hour(s)).  Assessment/Plan: Vanna Sailer is a 61 y.o. female present for OV for  Hyperlipidemia, unspecified hyperlipidemia type - Family history present, elevated cholesterol and recent collection. Patient is agreeable to starting simvastatin. She can take Q10 if desired to avoid myalgias. Follow-up in 3 months with repeat lipids and CMP. - simvastatin (ZOCOR) 20 MG tablet; Take 1 tablet (20 mg total) by mouth at bedtime.  Dispense: 90 tablet; Refill: 1  Thyroid nodule Abnormal TSH - Discussed and reviewed thyroid ultrasound and labs with patient today. Essentially ultrasound is unchanged, if anything improving. But not need any further evaluations unless thyroid studies abnormal or patient became symptomatic    Reviewed expectations re: course of current medical issues.  Discussed  self-management of symptoms.  Outlined signs and symptoms indicating  need for more acute intervention.  Patient verbalized understanding and all questions were answered.  Patient received an After-Visit Summary.   Note is dictated utilizing voice recognition software. Although note has been proof read prior to signing, occasional typographical errors still can be missed. If any questions arise, please do not hesitate to call for verification.   electronically signed by:  Howard Pouch, DO  [place]

## 2016-11-11 DIAGNOSIS — Z1231 Encounter for screening mammogram for malignant neoplasm of breast: Secondary | ICD-10-CM | POA: Diagnosis not present

## 2017-01-02 DIAGNOSIS — H1030 Unspecified acute conjunctivitis, unspecified eye: Secondary | ICD-10-CM | POA: Diagnosis not present

## 2017-01-05 DIAGNOSIS — M129 Arthropathy, unspecified: Secondary | ICD-10-CM | POA: Diagnosis not present

## 2017-01-05 DIAGNOSIS — I10 Essential (primary) hypertension: Secondary | ICD-10-CM | POA: Diagnosis not present

## 2017-01-05 DIAGNOSIS — Z889 Allergy status to unspecified drugs, medicaments and biological substances status: Secondary | ICD-10-CM | POA: Diagnosis not present

## 2017-01-05 DIAGNOSIS — K219 Gastro-esophageal reflux disease without esophagitis: Secondary | ICD-10-CM | POA: Diagnosis not present

## 2017-01-15 DIAGNOSIS — N930 Postcoital and contact bleeding: Secondary | ICD-10-CM | POA: Diagnosis not present

## 2017-01-15 DIAGNOSIS — Z01419 Encounter for gynecological examination (general) (routine) without abnormal findings: Secondary | ICD-10-CM | POA: Diagnosis not present

## 2017-01-15 DIAGNOSIS — N859 Noninflammatory disorder of uterus, unspecified: Secondary | ICD-10-CM | POA: Diagnosis not present

## 2017-01-15 DIAGNOSIS — Z6832 Body mass index (BMI) 32.0-32.9, adult: Secondary | ICD-10-CM | POA: Diagnosis not present

## 2017-01-15 DIAGNOSIS — N926 Irregular menstruation, unspecified: Secondary | ICD-10-CM | POA: Diagnosis not present

## 2017-01-15 LAB — HM PAP SMEAR: HM Pap smear: NORMAL

## 2017-02-12 ENCOUNTER — Ambulatory Visit: Payer: BLUE CROSS/BLUE SHIELD | Admitting: Family Medicine

## 2017-02-17 ENCOUNTER — Ambulatory Visit (HOSPITAL_BASED_OUTPATIENT_CLINIC_OR_DEPARTMENT_OTHER)
Admission: RE | Admit: 2017-02-17 | Discharge: 2017-02-17 | Disposition: A | Discharge status: Home / Self Care | Payer: BLUE CROSS/BLUE SHIELD | Source: Ambulatory Visit | Attending: Pulmonary Disease | Admitting: Pulmonary Disease

## 2017-02-17 DIAGNOSIS — I700 Atherosclerosis of aorta: Secondary | ICD-10-CM | POA: Diagnosis not present

## 2017-02-17 DIAGNOSIS — I2510 Atherosclerotic heart disease of native coronary artery without angina pectoris: Secondary | ICD-10-CM | POA: Insufficient documentation

## 2017-02-17 DIAGNOSIS — R918 Other nonspecific abnormal finding of lung field: Secondary | ICD-10-CM

## 2017-02-18 ENCOUNTER — Ambulatory Visit (INDEPENDENT_AMBULATORY_CARE_PROVIDER_SITE_OTHER): Payer: BLUE CROSS/BLUE SHIELD | Admitting: Family Medicine

## 2017-02-18 ENCOUNTER — Encounter: Payer: Self-pay | Admitting: Family Medicine

## 2017-02-18 ENCOUNTER — Ambulatory Visit: Payer: BLUE CROSS/BLUE SHIELD | Admitting: Family Medicine

## 2017-02-18 VITALS — BP 136/82 | HR 70 | Temp 98.0°F | Resp 20 | Ht 63.0 in | Wt 189.8 lb

## 2017-02-18 DIAGNOSIS — J301 Allergic rhinitis due to pollen: Secondary | ICD-10-CM | POA: Diagnosis not present

## 2017-02-18 DIAGNOSIS — H1013 Acute atopic conjunctivitis, bilateral: Secondary | ICD-10-CM | POA: Diagnosis not present

## 2017-02-18 DIAGNOSIS — I499 Cardiac arrhythmia, unspecified: Secondary | ICD-10-CM

## 2017-02-18 DIAGNOSIS — F418 Other specified anxiety disorders: Secondary | ICD-10-CM | POA: Diagnosis not present

## 2017-02-18 DIAGNOSIS — I10 Essential (primary) hypertension: Secondary | ICD-10-CM

## 2017-02-18 MED ORDER — OLOPATADINE HCL 0.2 % OP SOLN: 1.0000 [drp] | Freq: Every day | OPHTHALMIC | 0 refills | Status: AC

## 2017-02-18 MED ORDER — DULOXETINE HCL 60 MG PO CPEP: 60.0000 mg | ORAL_CAPSULE | Freq: Every day | ORAL | 1 refills | Status: DC

## 2017-02-18 MED ORDER — LEVOCETIRIZINE DIHYDROCHLORIDE 5 MG PO TABS: 5.0000 mg | ORAL_TABLET | Freq: Every evening | ORAL | 3 refills | Status: DC

## 2017-02-18 NOTE — Patient Instructions (Signed)
It was great to see you today. I have refilled your cymbalta and called in new medications for your allergies.  follo wup every 6 months on chronic medical conditions.   Please help [place] help you:  We are honored you have chosen [facility] for your Primary Care home. Below you will find basic instructions that you may need to access in the future. Please help [place] help you by reading the instructions, which cover many of the frequent questions we experience.   Prescription refills and request:  -In order to allow more efficient response time, please call your pharmacy for all refills. They will forward the request electronically to [place]. This allows for the quickest possible response. Request left on a nurse line can take longer to refill, since these are checked as time allows between office patients and other phone calls.  - refill request can take up to 3-5 working days to complete.  - If request is sent electronically and request is appropiate, it is usually completed in 1-2 business days.  - all patients will need to be seen routinely for all chronic medical conditions requiring prescription medications (see follow-up below). If you are overdue for follow up on your condition, you will be asked to make an appointment and we will call in enough medication to cover you until your appointment (up to 30 days).  - all controlled substances will require a face to face visit to request/refill.  - if you desire your prescriptions to go through a new pharmacy, and have an active script at original pharmacy, you will need to call your pharmacy and have scripts transferred to new pharmacy. This is completed between the pharmacy locations and not by your provider.    Results: If any images or labs were ordered, it can take up to 1 week to get results depending on the test ordered and the lab/facility running and resulting the test. - Normal or stable results, which do not need further discussion, may be  released to your mychart immediately with attached note to you. A call may not be generated for normal results. Please make certain to sign up for mychart. If you have questions on how to activate your mychart you can call the front office.  - If your results need further discussion, our office will attempt to contact you via phone, and if unable to reach you after 2 attempts, we will release your abnormal result to your mychart with instructions.  - All results will be automatically released in mychart after 1 week.  - Your provider will provide you with explanation and instruction on all relevant material in your results. Please keep in mind, results and labs may appear confusing or abnormal to the untrained eye, but it does not mean they are actually abnormal for you personally. If you have any questions about your results that are not covered, or you desire more detailed explanation than what was provided, you should make an appointment with your provider to do so.   Our office handles many outgoing and incoming calls daily. If we have not contacted you within 1 week about your results, please check your mychart to see if there is a message first and if not, then contact our office.  In helping with this matter, you help decrease call volume, and therefore allow [place] to be able to respond to patients needs more efficiently.   Acute office visits (sick visit):  An acute visit is intended for a new problem  and are scheduled in shorter time slots to allow schedule openings for patients with new problems. This is the appropriate visit to discuss a new problem. In order to provide you with excellent quality medical care with proper time for you to explain your problem, have an exam and receive treatment with instructions, these appointments should be limited to one new problem per visit. If you experience a new problem, in which you desire to be addressed, please make an acute office visit, we save openings on  the schedule to accommodate you. Please do not save your new problem for any other type of visit, let us take care of it properly and quickly for you.   Follow up visits:  Depending on your condition(s) your provider will need to see you routinely in order to provide you with quality care and prescribe medication(s). Most chronic conditions (Example: hypertension, Diabetes, depression/anxiety... etc), require visits a couple times a year. Your provider will instruct you on proper follow up for your personal medical conditions and history. Please make certain to make follow up appointments for your condition as instructed. Failing to do so could result in lapse in your medication treatment/refills. If you request a refill, and are overdue to be seen on a condition, we will always provide you with a 30 day script (once) to allow you time to schedule.    Medicare wellness (well visit): - we have a wonderful Nurse Maudie Mercury), that will meet with you and provide you will yearly medicare wellness visits. These visits should occur yearly (can not be scheduled less than 1 calendar year apart) and cover preventive health, immunizations, advance directives and screenings you are entitled to yearly through your medicare benefits. Do not miss out on your entitled benefits, this is when medicare will pay for these benefits to be ordered for you.  These are strongly encouraged by your provider and is the appropriate type of visit to make certain you are up to date with all preventive health benefits. If you have not had your medicare wellness exam in the last 12 months, please make certain to schedule one by calling the office and schedule your medicare wellness with Maudie Mercury as soon as possible.   Yearly physical (well visit):  - Adults are recommended to be seen yearly for physicals. Check with your insurance and date of your last physical, most insurances require one calendar year between physicals. Physicals include all  preventive health topics, screenings, medical exam and labs that are appropriate for gender/age and history. You may have fasting labs needed at this visit. This is a well visit (not a sick visit), new problems should not be covered during this visit (see acute visit).  - Pediatric patients are seen more frequently when they are younger. Your provider will advise you on well child visit timing that is appropriate for your their age. - This is not a medicare wellness visit. Medicare wellness exams do not have an exam portion to the visit. Some medicare companies allow for a physical, some do not allow a yearly physical. If your medicare allows a yearly physical you can schedule the medicare wellness with our nurse Maudie Mercury and have your physical with your provider after, on the same day. Please check with insurance for your full benefits.   Late Policy/No Shows:  - all new patients should arrive 15-30 minutes earlier than appointment to allow [place] time  to  obtain all personal demographics,  insurance information and for you to complete office paperwork. -  All established patients should arrive 10-15 minutes earlier than appointment time to update all information and be checked in .  - In our best efforts to run on time, if you are late for your appointment you will be asked to either reschedule or if able, we will work you back into the schedule. There will be a wait time to work you back in the schedule,  depending on availability.  - If you are unable to make it to your appointment as scheduled, please call 24 hours ahead of time to allow [place] to fill the time slot with someone else who needs to be seen. If you do not cancel your appointment ahead of time, you may be charged a no show fee.

## 2017-02-18 NOTE — Progress Notes (Signed)
Regina Black , November 14, 1955, 61 y.o., female MRN: [id number] Patient Care Team    Relationship Specialty Notifications Start End  [name], DO PCP - General Family Medicine  11/22/15   Denton Brick., MD  Cardiology  11/22/15   Luellen Pucker, MD  Obstetrics and Gynecology  12/10/15   Efrain Sella, MD Referring Physician Internal Medicine  12/27/15   Juanito Doom, MD Consulting Physician Pulmonary Disease  10/02/16    Comment: pulm nodules   Chief Complaint  Patient presents with  . Depression  . Anxiety    Subjective:  Depression with anxiety: Patient reports the Cymbalta 60 mg daily is working well for her, she reports compliance. She feels she is starting to get over the multiple family that she has experienced over the last year. She denies any side effects to the medications, and would like refills today. Depression screening completed today, with score of 4.   Hypertension/cardiac arrhythmia/BMI>30/hyperlipidemia: Pt reports compliance with metoprolol XL 75 mg daily, lisinopril 40 mg daily and HCTZ 25 mg daily. Blood pressures ranges at home are within normal limits. Patient denies chest pain, shortness of breath or lower extremity edema. Pt does not take daily baby ASA. Pt is has been prescribed but does not take a statin. BMP: 09/27/2016 within normal limits CBC: 09/27/2016 within normal limits Lipids: 09/27/2016 total cholesterol 238, HDL 63, LDL 154, triglycerides 27 Diet: Low-sodium Exercise: Not routinely RF: Hypertension, hyperlipidemia, obesity, heart disease, stroke  Seasonal allergies: Patient reports her allergies have been worse over the last couple weeks. She is taking daily Claritin, using Flonase and an over-the-counter allergy eyedrop. She reports this morning her allergies were increased, her eyes are very itchy. She has tried other medications as well including Zyrtec and Allegra in the past that did not control her allergies  well.  Depression screen [facility] 2/9 02/18/2017 11/07/2016 12/20/2015 11/22/2015  Decreased Interest 0 0 0 0  Down, Depressed, Hopeless 0 0 0 0  PHQ - 2 Score 0 0 0 0  Altered sleeping 0 - - -  Tired, decreased energy 3 - - -  Change in appetite 0 - - -  Feeling bad or failure about yourself  0 - - -  Trouble concentrating 1 - - -  Moving slowly or fidgety/restless 0 - - -  Suicidal thoughts 0 - - -  PHQ-9 Score 4 - - -  Difficult doing work/chores Somewhat difficult - - -    Allergies  Allergen Reactions  . Butorphanol Other (See Comments)    syncope  . Codeine Nausea And Vomiting   Social History  Substance Use Topics  . Smoking status: Former Smoker    Packs/day: 2.00    Years: 25.00    Types: Cigarettes    Quit date: 06/09/1998  . Smokeless tobacco: Never Used  . Alcohol use 1.2 oz/week    2 Glasses of wine per week   Past Medical History:  Diagnosis Date  . Allergy   . Anxiety   . Arrhythmia   . Asthma   . Blood transfusion without reported diagnosis   . Chicken pox   . Depression   . Diverticulosis 2010   Mild left sided by EGD/Colonoscopy Dr. Alice Reichert  . Eczema   . Endometriosis   . Fatty liver   . GERD (gastroesophageal reflux disease)   . Headache   . Heart murmur   . Hiatal hernia 2010   "small"  . History of cardiovascular  stress test 04/2013   negative per OV note from Dr. Marijo File  . History of echocardiogram 04/2013   normal study; EF 75% per Dr. Marijo File  . History of EKG 2016   NSR with PVC, Dr. Marijo File  . History of Holter monitoring 2014   palpitations/pvc  . Hyperlipidemia   . Hypertension   . Hypokalemia   . Internal hemorrhoid 2010   small  . Osteoarthritis   . PCR DNA positive for HSV1   . PONV (postoperative nausea and vomiting)   . Prediabetes   . Prediabetes   . PVC (premature ventricular contraction) 2014   palpitations  . Thyroid nodule   . Vertigo   . Vitamin D deficiency    Past Surgical History:  Procedure Laterality Date   . BREAST SURGERY     reduction  . COLONOSCOPY  2010  . ESOPHAGOGASTRODUODENOSCOPY  2010  . EXPLORATORY LAPAROTOMY     endometriosis  . LASIK    . RECTAL SURGERY    . RECTOCELE REPAIR  07/2014  . thyroid nodule asp  01/11/2013  . TONSILLECTOMY AND ADENOIDECTOMY    . TUBAL LIGATION    . VIDEO BRONCHOSCOPY WITH ENDOBRONCHIAL NAVIGATION N/A 03/05/2016   Procedure: VIDEO BRONCHOSCOPY WITH ENDOBRONCHIAL NAVIGATION;  Surgeon: Collene Gobble, MD;  Location: [facility] OR;  Service: Thoracic;  Laterality: N/A;   Family History  Problem Relation Age of Onset  . Lung cancer Mother   . Heart disease Mother   . Alcohol abuse Father   . Heart disease Father   . Breast cancer Paternal Aunt 88  . Heart disease Maternal Grandmother   . Arthritis Maternal Grandmother   . Arthritis Maternal Grandfather   . Heart disease Maternal Grandfather   . Hearing loss Maternal Grandfather   . Stroke Maternal Grandfather   . Diabetes Paternal Grandmother   . Hearing loss Paternal Grandfather    Allergies as of 02/18/2017      Reactions   Butorphanol Other (See Comments)   syncope   Codeine Nausea And Vomiting      Medication List       Accurate as of 02/18/17  9:10 AM. Always use your most recent med list.          albuterol 108 (90 Base) MCG/ACT inhaler Commonly known as:  PROVENTIL HFA;VENTOLIN HFA Inhale 2 puffs into the lungs every 6 (six) hours as needed for wheezing or shortness of breath.   DULoxetine 60 MG capsule Commonly known as:  CYMBALTA Take 1 capsule (60 mg total) by mouth daily. Needs office visit prior to anymore refills   fluticasone 50 MCG/ACT nasal spray Commonly known as:  FLONASE Place 2 sprays into both nostrils daily as needed (allergies/congestion).   hydrochlorothiazide 25 MG tablet Commonly known as:  HYDRODIURIL Take 25 mg by mouth daily.   ibuprofen 800 MG tablet Commonly known as:  ADVIL,MOTRIN Take 800 mg by mouth every 8 (eight) hours as needed for mild pain.    lisinopril 40 MG tablet Commonly known as:  PRINIVIL,ZESTRIL Take 40 mg by mouth daily.   loratadine 10 MG tablet Commonly known as:  CLARITIN Take 10 mg by mouth daily.   metoprolol succinate 25 MG 24 hr tablet Commonly known as:  TOPROL-XL Take 3 tablets (75 mg total) by mouth daily. Take with or immediately following a meal.   omeprazole 20 MG capsule Commonly known as:  PRILOSEC Take 20 mg by mouth daily.   penciclovir 1 % cream Commonly known as:  DENAVIR Apply 1 application topically every 4 (four) hours as needed (applied to lip area for recurrent cold sores).   simvastatin 20 MG tablet Commonly known as:  ZOCOR Take 1 tablet (20 mg total) by mouth at bedtime.   valACYclovir 1000 MG tablet Commonly known as:  VALTREX Take 1 g by mouth 2 (two) times daily as needed. For fever blisters/cold sores.   VITAMIN B50 COMPLEX PO Take by mouth.       No results found for this or any previous visit (from the past 24 hour(s)). No results found.  ROS: Negative, with the exception of above mentioned in HPI  Objective:  BP 136/82 (BP Location: Left Arm, Patient Position: Sitting, Cuff Size: Large)   Pulse 70   Temp 98 F (36.7 C)   Resp 20   Ht 5\' 3"  (1.6 m)   Wt 189 lb 12 oz (86.1 kg)   SpO2 97%   BMI 33.61 kg/m  Body mass index is 33.61 kg/m. Gen: Afebrile. No acute distress. Nontoxic in appearance, well developed, well nourished.  HENT: AT. [place].MMM MSK/neuro: No erythema, no soft tissue swelling. Bilateral lower trapezium muscle ropiness. No cervical spine or thoracic spine bone tenderness. Full range of motion bilateral arms and cervical spine. Negative empty can test, negative [place]. Muscle strength 5/5 bilateral upper extremity. Neurovascularly intact distally. Psych: Normal affect, dress and demeanor. Normal speech. Normal thought content and judgment.  Assessment/Plan: Leiana Rund is a 61 y.o. female present for acute OV for  Essential hypertension,  benign/morbid obesity Cardiac arrhythmia, unspecified cardiac arrhythmia type - Continue metoprolol, lisinopril and HCTZ at current doses. Patient refused these medications and they care for them through her work clinic. - Low-sodium diet, increase exercise. - Labs are up-to-date. Patient has refused statin therapy. - Consider start a baby aspirin. - Follow-up every 6 months.  Depression with anxiety - Stable today. Continue Cymbalta 60 mg daily. - Refills on Cymbalta 60 mg daily for 6 months. - Follow-up in 6 months.  Seasonal allergic rhinitis due to pollen Increase in seasonal allergies over the last few weeks. Has tried all over-the-counter antihistamines. Will prescribe Xyzal and Patanol eyedrops for her today. - Continue Flonase - Discontinue Claritin - f/u prn   Reviewed expectations re: course of current medical issues.  Discussed self-management of symptoms.  Outlined signs and symptoms indicating need for more acute intervention.  Patient verbalized understanding and all questions were answered.  Patient received an After-Visit Summary.   electronically signed by:  Howard Pouch, DO  [place]

## 2017-02-19 ENCOUNTER — Encounter: Payer: Self-pay | Admitting: Pulmonary Disease

## 2017-02-19 ENCOUNTER — Ambulatory Visit (INDEPENDENT_AMBULATORY_CARE_PROVIDER_SITE_OTHER): Payer: BLUE CROSS/BLUE SHIELD | Admitting: Pulmonary Disease

## 2017-02-19 VITALS — BP 132/72 | HR 78 | Ht 64.0 in | Wt 192.5 lb

## 2017-02-19 DIAGNOSIS — R918 Other nonspecific abnormal finding of lung field: Secondary | ICD-10-CM | POA: Diagnosis not present

## 2017-02-19 NOTE — Patient Instructions (Signed)
For your abnormal CT scan with a groundglass nodules. I am going to refer you to Dr. Roxan Hockey in thoracic surgery for consideration of resection.  We will see you back in 6 months or sooner if needed

## 2017-02-19 NOTE — Progress Notes (Signed)
Subjective:    Patient ID: Regina Black, female    DOB: [birth year], 61 y.o.   MRN: [id number]  Synopsis: Referred in September 2017 for evaluation of a pulmonary nodules. Underwent navigational bronchoscopy in September 2017 all biopsy results were negative for malignancy, cultures also negative  HPI Chief Complaint  Patient presents with  . Follow-up    pt states she is at baseline normal. Here to speak about scan results.    Regina Black says she feels well. No recent cough or hemoptysis. No weight loss. No shortness of breath. She is here today to review the CT scan performed earlier this week.  Past Medical History:  Diagnosis Date  . Allergy   . Anxiety   . Arrhythmia   . Asthma   . Blood transfusion without reported diagnosis   . Chicken pox   . Depression   . Diverticulosis 2010   Mild left sided by EGD/Colonoscopy Dr. Alice Reichert  . Eczema   . Endometriosis   . Fatty liver   . GERD (gastroesophageal reflux disease)   . Headache   . Heart murmur   . Hiatal hernia 2010   "small"  . History of cardiovascular stress test 04/2013   negative per OV note from Dr. Marijo File  . History of echocardiogram 04/2013   normal study; EF 75% per Dr. Marijo File  . History of EKG 2016   NSR with PVC, Dr. Marijo File  . History of Holter monitoring 2014   palpitations/pvc  . Hyperlipidemia   . Hypertension   . Hypokalemia   . Internal hemorrhoid 2010   small  . Osteoarthritis   . PCR DNA positive for HSV1   . PONV (postoperative nausea and vomiting)   . Prediabetes   . PVC (premature ventricular contraction) 2014   palpitations  . Thyroid nodule   . Vertigo   . Vitamin D deficiency       Review of Systems  Constitutional: Negative for chills, fatigue and fever.  HENT: Negative for nosebleeds, postnasal drip and rhinorrhea.   Respiratory: Positive for chest tightness. Negative for shortness of breath and wheezing.   Cardiovascular: Negative for chest pain, palpitations and leg  swelling.       Objective:   Physical Exam  Vitals:   02/19/17 1629  BP: 132/72  Pulse: 78  SpO2: 94%  Weight: 192 lb 8 oz (87.3 kg)  Height: 5\' 4"  (1.626 m)   Room air  Gen: well appearing HENT: OP clear, TM's clear, neck supple PULM: CTA B, normal percussion CV: RRR, no mgr, trace edema GI: BS+, soft, nontender Derm: no cyanosis or rash Psyche: normal mood and affect   Bronchoscopy report: Bbronchoscopy in September 2017 no malignancy seen Bronchoscopy operative report reviewed her multiple biopsies were performed in the right and left lung of the groundglass nodules Micro from bronch: negative Simple spirometry from today: Poor quality data  February 2018 CT chest images personally reviewed with the patient today in clinic: She has persistent upper lobe groundglass nodules, one on the right has increased slightly in size from 11.4-11.7 mm. There is also a new right upper lobe nodule which is subcentimeter. 02/17/2017 CT chest images personally reviewed showing persistent groundglass nodules, one in the left upper lobe, 2 in the right upper lobe, no change compared to February 2018.     Assessment & Plan:  Multiple pulmonary nodules - Plan: Ambulatory referral to Cardiothoracic Surgery  Discussion: Regina Black is bothered by the fact that the reading from the most  recent CT scan of her chest suggest that she has adenocarcinoma. In a lengthy conversation today I reassured her that the biopsy results from the biopsy we performed earlier this year showed no evidence of cancer. I explained to her that the differential diagnosis of these nodules includes inflammatory lesions versus less likely slow growing adenocarcinoma. However, she's had a number of friends in first-degree relatives (including her mother) who have had lung cancer so this is quite stressful for her. The best approach may be to just perform a resection of one or more of these lesions so that she knows for certain  what's going on.  Plan: Refer to thoracic surgery for evaluation of resection of one of her groundglass nodules.    Current Outpatient Prescriptions:  .  B Complex-Biotin-FA (VITAMIN B50 COMPLEX PO), Take by mouth., Disp: , Rfl:  .  DULoxetine (CYMBALTA) 60 MG capsule, Take 1 capsule (60 mg total) by mouth daily. Needs office visit prior to anymore refills, Disp: 90 capsule, Rfl: 1 .  fluticasone (FLONASE) 50 MCG/ACT nasal spray, Place 2 sprays into both nostrils daily as needed (allergies/congestion)., Disp: , Rfl:  .  hydrochlorothiazide (HYDRODIURIL) 25 MG tablet, Take 25 mg by mouth daily., Disp: , Rfl:  .  levocetirizine (XYZAL) 5 MG tablet, Take 1 tablet (5 mg total) by mouth every evening., Disp: 90 tablet, Rfl: 3 .  lisinopril (PRINIVIL,ZESTRIL) 40 MG tablet, Take 40 mg by mouth daily., Disp: , Rfl:  .  metoprolol succinate (TOPROL-XL) 25 MG 24 hr tablet, Take 3 tablets (75 mg total) by mouth daily. Take with or immediately following a meal., Disp: 90 tablet, Rfl: 0 .  Olopatadine HCl 0.2 % SOLN, Apply 1 drop to eye daily. 1 drop each eye daily, Disp: 1 Bottle, Rfl: 0 .  omeprazole (PRILOSEC) 20 MG capsule, Take 20 mg by mouth daily. , Disp: , Rfl:  .  penciclovir (DENAVIR) 1 % cream, Apply 1 application topically every 4 (four) hours as needed (applied to lip area for recurrent cold sores). , Disp: , Rfl:  .  valACYclovir (VALTREX) 1000 MG tablet, Take 1 g by mouth 2 (two) times daily as needed. For fever blisters/cold sores., Disp: , Rfl: 0

## 2017-03-02 ENCOUNTER — Encounter: Payer: BLUE CROSS/BLUE SHIELD | Admitting: Thoracic Surgery (Cardiothoracic Vascular Surgery)

## 2017-03-03 ENCOUNTER — Encounter: Payer: Self-pay | Admitting: Thoracic Surgery (Cardiothoracic Vascular Surgery)

## 2017-03-03 ENCOUNTER — Institutional Professional Consult (permissible substitution) (INDEPENDENT_AMBULATORY_CARE_PROVIDER_SITE_OTHER): Payer: BLUE CROSS/BLUE SHIELD | Admitting: Thoracic Surgery (Cardiothoracic Vascular Surgery)

## 2017-03-03 VITALS — BP 156/85 | HR 84 | Resp 16 | Ht 64.0 in | Wt 190.0 lb

## 2017-03-03 DIAGNOSIS — R918 Other nonspecific abnormal finding of lung field: Secondary | ICD-10-CM | POA: Diagnosis not present

## 2017-03-03 NOTE — Progress Notes (Signed)
PCP is [name], DO Referring Provider is Juanito Doom, MD  Chief Complaint  Patient presents with  . Lung Lesion    CT CHEST 02/17/17...negative ENB 02/2016    HPI: Mrs. Yokoyama is a 61 year old woman sent for consultation regarding multiple groundglass lung nodules  Mrs. Sholl is a 61 year old woman with a past medical history of hypertension, hyperlipidemia, heart murmur, asthma, gastroesophageal reflux, hypertension, hyperlipidemia, thyroid nodule, "prediabetes" and multiple lung nodules. Her lung nodules were first noted in 2016. He presented at that time with chest pain, shortness of breath, and nausea. Workup included a CT angiogram at [facility] to rule out pulmonary embolus. She was found to have multiple groundglass opacities. She had a repeat scan in August 2017 which again showed multiple groundglass opacities bilaterally. She had navigational bronchoscopy in 2017. Biopsies from the right upper lobe and left upper lobe but it showed inflammation. No tumor was seen. Radiographic follow-up was recommended.  She recently had a follow-up CT scan, which showed no significant change in the nodules, but did put in the report that this could be low-grade adenocarcinomas. This made her extremely anxious. After discussion with Dr. Lake Bells she requested a thoracic surgical opinion regarding resection.  Of note her mother did have surgery for lung cancer.  She smokes 2 packs per day prior to quitting 18 years ago (50-pack-year history). She does not have any chest pain, pressure, or tightness. She denies shortness of breath, cough, and wheezing. She has not traveled to the desert or out of the country. She does not have any known exposures to TB or mold. Her appetite is good and her weight is stable. Zubrod Score: At the time of surgery this patient's most appropriate activity status/level should be described as: [x]     0    Normal activity, no symptoms []     1    Restricted in  physical strenuous activity but ambulatory, able to do out light work []     2    Ambulatory and capable of self care, unable to do work activities, up and about >50 % of waking hours                              []     3    Only limited self care, in bed greater than 50% of waking hours []     4    Completely disabled, no self care, confined to bed or chair []     5    Moribund  Past Medical History:  Diagnosis Date  . Allergy   . Anxiety   . Arrhythmia   . Asthma   . Blood transfusion without reported diagnosis   . Chicken pox   . Depression   . Diverticulosis 2010   Mild left sided by EGD/Colonoscopy Dr. Alice Reichert  . Eczema   . Endometriosis   . Fatty liver   . GERD (gastroesophageal reflux disease)   . Headache   . Heart murmur   . Hiatal hernia 2010   "small"  . History of cardiovascular stress test 04/2013   negative per OV note from Dr. Marijo File  . History of echocardiogram 04/2013   normal study; EF 75% per Dr. Marijo File  . History of EKG 2016   NSR with PVC, Dr. Marijo File  . History of Holter monitoring 2014   palpitations/pvc  . Hyperlipidemia   . Hypertension   . Hypokalemia   . Internal  hemorrhoid 2010   small  . Osteoarthritis   . PCR DNA positive for HSV1   . PONV (postoperative nausea and vomiting)   . Prediabetes   . PVC (premature ventricular contraction) 2014   palpitations  . Thyroid nodule   . Vertigo   . Vitamin D deficiency     Past Surgical History:  Procedure Laterality Date  . BREAST SURGERY     reduction  . COLONOSCOPY  2010  . ESOPHAGOGASTRODUODENOSCOPY  2010  . EXPLORATORY LAPAROTOMY     endometriosis  . LASIK    . RECTAL SURGERY    . RECTOCELE REPAIR  07/2014  . thyroid nodule asp  01/11/2013  . TONSILLECTOMY AND ADENOIDECTOMY    . TUBAL LIGATION    . VIDEO BRONCHOSCOPY WITH ENDOBRONCHIAL NAVIGATION N/A 03/05/2016   Procedure: VIDEO BRONCHOSCOPY WITH ENDOBRONCHIAL NAVIGATION;  Surgeon: Collene Gobble, MD;  Location: [facility] OR;  Service:  Thoracic;  Laterality: N/A;    Family History  Problem Relation Age of Onset  . Lung cancer Mother   . Heart disease Mother   . Alcohol abuse Father   . Heart disease Father   . Breast cancer Paternal Aunt 54  . Heart disease Maternal Grandmother   . Arthritis Maternal Grandmother   . Arthritis Maternal Grandfather   . Heart disease Maternal Grandfather   . Hearing loss Maternal Grandfather   . Stroke Maternal Grandfather   . Diabetes Paternal Grandmother   . Hearing loss Paternal Grandfather     Social History Social History  Substance Use Topics  . Smoking status: Former Smoker    Packs/day: 2.00    Years: 25.00    Types: Cigarettes    Quit date: 06/09/1998  . Smokeless tobacco: Never Used  . Alcohol use 1.2 oz/week    2 Glasses of wine per week    Current Outpatient Prescriptions  Medication Sig Dispense Refill  . B Complex-Biotin-FA (VITAMIN B50 COMPLEX PO) Take by mouth.    . DULoxetine (CYMBALTA) 60 MG capsule Take 1 capsule (60 mg total) by mouth daily. Needs office visit prior to anymore refills 90 capsule 1  . fluticasone (FLONASE) 50 MCG/ACT nasal spray Place 2 sprays into both nostrils daily as needed (allergies/congestion).    . hydrochlorothiazide (HYDRODIURIL) 25 MG tablet Take 25 mg by mouth daily.    Marland Kitchen levocetirizine (XYZAL) 5 MG tablet Take 1 tablet (5 mg total) by mouth every evening. 90 tablet 3  . lisinopril (PRINIVIL,ZESTRIL) 40 MG tablet Take 40 mg by mouth daily.    . metoprolol succinate (TOPROL-XL) 25 MG 24 hr tablet Take 3 tablets (75 mg total) by mouth daily. Take with or immediately following a meal. 90 tablet 0  . Olopatadine HCl 0.2 % SOLN Apply 1 drop to eye daily. 1 drop each eye daily 1 Bottle 0  . omeprazole (PRILOSEC) 20 MG capsule Take 20 mg by mouth daily.     Marland Kitchen penciclovir (DENAVIR) 1 % cream Apply 1 application topically every 4 (four) hours as needed (applied to lip area for recurrent cold sores).     . valACYclovir (VALTREX) 1000 MG  tablet Take 1 g by mouth 2 (two) times daily as needed. For fever blisters/cold sores.  0   No current facility-administered medications for this visit.     Allergies  Allergen Reactions  . Butorphanol Other (See Comments)    syncope  . Codeine Nausea And Vomiting    Review of Systems  Constitutional: Negative for activity change,  appetite change, chills, fatigue, fever and unexpected weight change.  HENT: Negative for trouble swallowing and voice change.   Respiratory: Negative for cough, shortness of breath and wheezing.   Cardiovascular: Negative for chest pain and leg swelling.  Gastrointestinal: Positive for abdominal pain (Frequent heartburn if not on medication). Negative for nausea and vomiting.  Genitourinary: Negative for difficulty urinating and dysuria.  Musculoskeletal: Negative for arthralgias and myalgias.  Neurological: Negative for dizziness, seizures and syncope.  Hematological: Negative for adenopathy. Does not bruise/bleed easily.  All other systems reviewed and are negative.   BP (!) 156/85 (BP Location: Right Arm, Patient Position: Sitting, Cuff Size: Large)   Pulse 84   Resp 16   Ht 5\' 4"  (1.626 m)   Wt 190 lb (86.2 kg)   SpO2 96% Comment: ON RA  BMI 32.61 kg/m  Physical Exam  Constitutional: She is oriented to person, place, and time. She appears well-developed and well-nourished. No distress.  HENT:  Head: Normocephalic and atraumatic.  Mouth/Throat: No oropharyngeal exudate.  Eyes: Conjunctivae and EOM are normal. No scleral icterus.  Neck: Neck supple. No thyromegaly present.  Cardiovascular: Normal rate, regular rhythm, normal heart sounds and intact distal pulses.  Exam reveals no gallop and no friction rub.   No murmur heard. Pulmonary/Chest: Effort normal and breath sounds normal. No respiratory distress. She has no wheezes.  Abdominal: Soft. She exhibits no distension. There is no tenderness.  Musculoskeletal: She exhibits no edema or  deformity.  Lymphadenopathy:    She has no cervical adenopathy.  Neurological: She is alert and oriented to person, place, and time. No cranial nerve deficit.  Motor intact  Skin: Skin is warm and dry.  Vitals reviewed.    Diagnostic Tests: CT CHEST WITHOUT CONTRAST  TECHNIQUE: Multidetector CT imaging of the chest was performed following the standard protocol without IV contrast.  COMPARISON:  07/18/2016  FINDINGS: Cardiovascular: Normal heart size. Mild aortic atherosclerosis. Calcification within the LAD coronary artery identified.  Mediastinum/Nodes: 1.5 cm nodule arises from the left lobe of the thyroid gland. The trachea appears patent and is midline. Normal appearance of the esophagus. No enlarged mediastinal or hilar lymph nodes. No axillary or supraclavicular adenopathy.  Lungs/Pleura: No pleural effusion identified. Ground-glass attenuating nodule within the right upper lobe measures 1.4 cm, image 35 of series 3. Unchanged in size from 11/12/2014. This has a central solid component measuring 4 mm. Ground-glass attenuating nodule in the left upper lobe measures 1.4 cm, image 38 of series 3. Also unchanged from 07/18/2016, but is increased from 7 mm on 11/12/2014. Index right upper lobe lung nodule appears ground-glass measuring 8 mm. Stable from prior study. Perifissural ground-glass nodule in the left upper lobe measures 1 cm and is unchanged from 11/12/2014, image 53 of series 3.  Upper Abdomen: No acute abnormality.  Musculoskeletal: No chest wall mass or suspicious bone lesions identified.  IMPRESSION: 1. Stable appearance of bilateral ground-glass and part solid nodules compared with 07/18/2016. Suspicious for multifocal pulmonary adenocarcinoma. When compared with study from 11/12/2014 the 1.4 cm left upper lobe ground-glass nodule has increased in size from 7 mm. The other nodules have remained stable since 11/12/2014. 2.  Aortic  Atherosclerosis (ICD10-I70.0). 3. LAD coronary artery calcifications.   Electronically Signed   By: Kerby Moors M.D.   On: 02/18/2017 09:17 I personally reviewed the CT chest compared to her previous films from earlier in 2018 and from 2016. I concur with the findings noted above  Pulmonary function testing 04/08/2016  FVC 2.9 (90%) FEV1 2.0 (80%)  Impression: Mrs. Fleece is a 61 year old woman with a remote history of tobacco but has multiple groundglass opacities in both lungs, primarily in the upper lobes. Differential diagnosis includes multifocal adenocarcinoma, as well as infectious and inflammatory conditions. I did explain to her that a single negative bronchoscopic biopsy does not rule out the possibility of a low-grade adenocarcinoma.  We discussed multiple potential options including continued radiographic follow-up, CT guided biopsy, repeat navigational bronchoscopy, and VATS for wedge resection. We discussed the advantages and disadvantages of each of these approaches.  Reviewing the films I am concerned this could be low-grade adenocarcinoma based on the appearance of the larger lesion both upper lobes. The one on the right has a pleural tail and the one on the left has increased in size.  I did discuss what would be involved with surgical resection. The proposed procedure would be right VATS for wedge resection and possible lymph node sampling. Given the multifocal nature of the disease I do not think that an anatomic resection would be particularly beneficial for her. If this did turn out to be low-grade adenocarcinoma that would provide sufficient tissue for molecular testing. If it turns out to be infectious or inflammatory in nature, we would have adequate tissue for cultures and an excisional wedge resection would be more likely to yield a definitive diagnosis.  I described the general nature of VATS for wedge resection, including the need for general anesthesia, the  incisions to be used, the use of a drainage tube postoperatively, the expected hospital stay, and the overall recovery. She understands the indications, risks, benefits, and alternatives. She understands the risks include, but are not limited to death, MI, DVT, PE, bleeding, possible need for transfusion, infection, prolonged air leak, irregular heart rhythms, as well as the possibility of other unforeseeable complications.  She understands that surgery alone would not be cured of due to the multifocal nature of the disease.  She understands that there is nothing that controls immediate intervention and the other options are also reasonable alternatives.  Plan:  She wishes to talk these issues over with her husband for making a decision and will call me back after she has done so.  [name], MD [facility] [phone]

## 2017-03-04 ENCOUNTER — Encounter: Payer: BLUE CROSS/BLUE SHIELD | Admitting: Thoracic Surgery (Cardiothoracic Vascular Surgery)

## 2017-03-04 DIAGNOSIS — R918 Other nonspecific abnormal finding of lung field: Secondary | ICD-10-CM | POA: Diagnosis not present

## 2017-03-06 ENCOUNTER — Other Ambulatory Visit: Payer: Self-pay | Admitting: *Deleted

## 2017-03-06 DIAGNOSIS — R918 Other nonspecific abnormal finding of lung field: Secondary | ICD-10-CM

## 2017-03-18 NOTE — Pre-Procedure Instructions (Signed)
Regina Black  03/18/2017      [facility] - [place], [place] - [address] [place] [address] Phone: [phone] Fax: [phone]    Your procedure is scheduled on 03/23/2017.  Report to [facility] Admitting at [place].M.  Call this number if you have problems the morning of surgery:  [phone]   Remember:  Do not eat food or drink liquids after midnight.  Continue all other medications as directed by your physician except follow these medication instructions before surgery   Take these medicines the morning of surgery with A SIP OF WATER: Duloxetine (Cymbalta) Fluticasone (Flonase) - if needed Loratadine (Claritin) Metoprolol succinate (Toprol-XL) Olopatadine HCL eye drop - if needed Omeprazole (Prilosec) Valacyclovir (Valtrex) - if needed  7 days prior to surgery STOP taking any Aspirin (unless otherwise instructed by your surgeon), Aleve, Naproxen, Ibuprofen, Motrin, Advil, Goody's, BC's, all herbal medications, fish oil, and all vitamins    Do not wear jewelry, make-up or nail polish.  Do not wear lotions, powders, or perfumes, or deodorant.  Do not shave 48 hours prior to surgery.   Do not bring valuables to the hospital.  [facility] is not responsible for any belongings or valuables.  Contacts, eyeglasses, dentures or bridgework may not be worn into surgery.  Leave your suitcase in the car.  After surgery it may be brought to your room.  For patients admitted to the hospital, discharge time will be determined by your treatment team.  Patients discharged the day of surgery will not be allowed to drive home.   Name and phone number of your driver:    Special instructions:   [facility]- Preparing For Surgery  Before surgery, you can play an important role. Because skin is not sterile, your skin needs to be as free of germs as possible. You can reduce the number of germs on your skin by washing with  CHG (chlorahexidine gluconate) Soap before surgery.  CHG is an antiseptic cleaner which kills germs and bonds with the skin to continue killing germs even after washing.  Please do not use if you have an allergy to CHG or antibacterial soaps. If your skin becomes reddened/irritated stop using the CHG.  Do not shave (including legs and underarms) for at least 48 hours prior to first CHG shower. It is OK to shave your face.  Please follow these instructions carefully.   1. Shower the NIGHT BEFORE SURGERY and the MORNING OF SURGERY with CHG.   2. If you chose to wash your hair, wash your hair first as usual with your normal shampoo.  3. After you shampoo, rinse your hair and body thoroughly to remove the shampoo.  4. Use CHG as you would any other liquid soap. You can apply CHG directly to the skin and wash gently with a scrungie or a clean washcloth.   5. Apply the CHG Soap to your body ONLY FROM THE NECK DOWN.  Do not use on open wounds or open sores. Avoid contact with your eyes, ears, mouth and genitals (private parts). Wash genitals (private parts) with your normal soap.  6. Wash thoroughly, paying special attention to the area where your surgery will be performed.  7. Thoroughly rinse your body with warm water from the neck down.  8. DO NOT shower/wash with your normal soap after using and rinsing off the CHG Soap.  9. Pat yourself dry with a CLEAN TOWEL.  10. Wear CLEAN PAJAMAS to bed the night before surgery, wear comfortable clothes the morning of surgery  11. Place CLEAN SHEETS on your bed the night of your first shower and DO NOT SLEEP WITH PETS.    Day of Surgery: Shower as stated above. Do not apply any deodorants/lotions.  Please wear clean clothes to the hospital/surgery center.      Please read over the following fact sheets that you were given.

## 2017-03-19 ENCOUNTER — Encounter (HOSPITAL_COMMUNITY): Payer: Self-pay

## 2017-03-19 ENCOUNTER — Encounter (HOSPITAL_COMMUNITY)
Admission: RE | Admit: 2017-03-19 | Discharge: 2017-03-19 | Disposition: A | Discharge status: Home / Self Care | Payer: BLUE CROSS/BLUE SHIELD | Source: Ambulatory Visit | Attending: Thoracic Surgery (Cardiothoracic Vascular Surgery) | Admitting: Thoracic Surgery (Cardiothoracic Vascular Surgery)

## 2017-03-19 ENCOUNTER — Other Ambulatory Visit: Payer: Self-pay

## 2017-03-19 DIAGNOSIS — Z01810 Encounter for preprocedural cardiovascular examination: Secondary | ICD-10-CM | POA: Insufficient documentation

## 2017-03-19 DIAGNOSIS — R918 Other nonspecific abnormal finding of lung field: Secondary | ICD-10-CM | POA: Insufficient documentation

## 2017-03-19 DIAGNOSIS — Z01812 Encounter for preprocedural laboratory examination: Secondary | ICD-10-CM | POA: Diagnosis not present

## 2017-03-19 LAB — CBC
HCT: 39 % (ref 36.0–46.0)
Hemoglobin: 12.9 g/dL (ref 12.0–15.0)
MCH: 29.6 pg (ref 26.0–34.0)
MCHC: 33.1 g/dL (ref 30.0–36.0)
MCV: 89.4 fL (ref 78.0–100.0)
PLATELETS: 274 10*3/uL (ref 150–400)
RBC: 4.36 MIL/uL (ref 3.87–5.11)
RDW: 13.5 % (ref 11.5–15.5)
WBC: 7.7 10*3/uL (ref 4.0–10.5)

## 2017-03-19 LAB — URINALYSIS, ROUTINE W REFLEX MICROSCOPIC
BACTERIA UA: NONE SEEN
BILIRUBIN URINE: NEGATIVE
Glucose, UA: NEGATIVE mg/dL
Hgb urine dipstick: NEGATIVE
Ketones, ur: NEGATIVE mg/dL
NITRITE: NEGATIVE
PH: 6 (ref 5.0–8.0)
Protein, ur: NEGATIVE mg/dL
SPECIFIC GRAVITY, URINE: 1.005 (ref 1.005–1.030)

## 2017-03-19 LAB — COMPREHENSIVE METABOLIC PANEL
ALBUMIN: 3.9 g/dL (ref 3.5–5.0)
ALT: 27 U/L (ref 14–54)
AST: 37 U/L (ref 15–41)
Alkaline Phosphatase: 89 U/L (ref 38–126)
Anion gap: 13 (ref 5–15)
BUN: 13 mg/dL (ref 6–20)
CHLORIDE: 101 mmol/L (ref 101–111)
CO2: 22 mmol/L (ref 22–32)
Calcium: 9.3 mg/dL (ref 8.9–10.3)
Creatinine, Ser: 0.98 mg/dL (ref 0.44–1.00)
GFR calc Af Amer: >60 mL/min (ref >60)
GLUCOSE: 177 mg/dL — AB (ref 65–99)
POTASSIUM: 3.5 mmol/L (ref 3.5–5.1)
SODIUM: 136 mmol/L (ref 135–145)
Total Bilirubin: 0.7 mg/dL (ref 0.3–1.2)
Total Protein: 7.2 g/dL (ref 6.5–8.1)

## 2017-03-19 LAB — BLOOD GAS, ARTERIAL
ACID-BASE EXCESS: 3.1 mmol/L — AB (ref 0.0–2.0)
Bicarbonate: 26.5 mmol/L (ref 20.0–28.0)
Drawn by: 470591
FIO2: 21
O2 SAT: 99.1 %
PATIENT TEMPERATURE: 98.6
PCO2 ART: 36.1 mmHg (ref 32.0–48.0)
PO2 ART: 141 mmHg — AB (ref 83.0–108.0)
pH, Arterial: 7.479 — ABNORMAL HIGH (ref 7.350–7.450)

## 2017-03-19 LAB — PROTIME-INR
INR: 0.92
PROTHROMBIN TIME: 12.2 s (ref 11.4–15.2)

## 2017-03-19 LAB — SURGICAL PCR SCREEN
MRSA, PCR: NEGATIVE
STAPHYLOCOCCUS AUREUS: NEGATIVE

## 2017-03-19 LAB — APTT: APTT: 27 s (ref 24–36)

## 2017-03-19 LAB — ABO/RH: ABO/RH(D): A POS

## 2017-03-19 NOTE — Progress Notes (Signed)
PCP - renee Raoul Pitch Cardiologist - Marlane Mingle  Chest x-ray - needs DOS EKG - 03/19/17 Stress Test - requesting records ECHO - requesting records Cardiac Cath - denies  Sending to anesthesia for review of records   Patient denies shortness of breath, fever, cough and chest pain at PAT appointment   Patient verbalized understanding of instructions that were given to them at the PAT appointment. Patient was also instructed that they will need to review over the PAT instructions again at home before surgery.

## 2017-03-20 MED ORDER — DEXTROSE 5 % IV SOLN
1.5000 g | INTRAVENOUS | Status: AC
  Administered 2017-03-23: 1.5 g via INTRAVENOUS
  Filled 2017-03-20: qty 1.5

## 2017-03-20 NOTE — Progress Notes (Signed)
Anesthesia chart review: Patient is a 61 year old female scheduled for right VATS, wedge resection on 03/23/17 by Dr. Modesto Charon. She has multiple ground glass opacities in both lungs with previous biopsy negative for malignancy. Recent CT showed stable nodules although low-grade adenocarcinoma could not be ruled out. Wedge resection with possible lymph node sampling recommended in hopes for definitive diagnosis to help with treatment/management plan.  History includes former smoker (quit '00), postoperative nausea and vomiting, anxiety, asthma, GERD, hiatal hernia, depression, hyperlipidemia, prediabetes, thyroid nodule s/p aspiration, PVCs (on b-blocker), murmur, fatty liver, vertigo, hypertension, T&A, breast surgery, exploratory laparotomy, video bronchoscopy 03/05/16 (inflammation). BMI is consistent with obesity.   - PCP is Dr. Howard Pouch.  - Pulmonologist is Dr. Simonne Maffucci.  - She saw cardiologist Dr. Clarene Critchley (with [facility], but now with [facility]) in 2014 for chest tightness and PVCs and had negative treadmill stress test and echo. (Cardiology records are scanned under Media tab, Correspondence, 03/05/16.) By PCP notes, it appears she was started on B-blocker at that time.   Meds include Cymbalta, Flonase, HCTZ, lisinopril, Claritin, Toprol XL, Prilosec, Valtrex PRN.  BP 132/68   Pulse 72   Temp 36.7 C   Resp 18   Ht 5\' 4"  (1.626 m)   Wt 192 lb 4.8 oz (87.2 kg)   SpO2 97%   BMI 33.01 kg/m   EKG 03/19/17: NSR.   Echo 05/11/13 [facility]): Conclusion: Normal LV systolic function. LVEF 75%. No significant valvular disease (trace TR).  Treadmill Stress Test 05/11/13([facility]): Conclusion: Negative for ischemia and patient achieved adequate or target heart rate. Exercise tolerance, 8.8 METS. Normal blood pressure response to exercise. Chest pain at peak exercise was frequent PVCs and a few couplets  during recovery. No ST segment depression noted.  CT Chest w/o contrast 02/17/17: IMPRESSION: 1. Stable appearance of bilateral ground-glass and part solid nodules compared with 07/18/2016. Suspicious for multifocal pulmonary adenocarcinoma. When compared with study from 11/12/2014 the 1.4 cm left upper lobe ground-glass nodule has increased in size from 7 mm. The other nodules have remained stable since 11/12/2014. 2.  Aortic Atherosclerosis (ICD10-I70.0). 3. LAD coronary artery calcifications.  Pulmonary function testing 04/08/2016 FVC 2.9 (90%) FEV1 2.0 (80%)  Preoperative labs noted. CBC, PT/PTT WNL. AST/ALT WNL. Cr 0.98. Glucose 177. Last A1c seen was on 12/20/15 and was 5.5. Will get a fasting CBG on the day of surgery.   She is for CXR on the day of surgery.   If no acute changes then I anticipate that she can proceed as planned.  [facility] Short Stay Center/Anesthesiology Phone [phone] 03/20/2017 12:26 PM

## 2017-03-23 ENCOUNTER — Inpatient Hospital Stay (HOSPITAL_COMMUNITY): Payer: BLUE CROSS/BLUE SHIELD | Admitting: Vascular Surgery

## 2017-03-23 ENCOUNTER — Encounter (HOSPITAL_COMMUNITY)
Admission: RE | Disposition: A | Discharge status: Home / Self Care | Payer: Self-pay | Source: Ambulatory Visit | Attending: Thoracic Surgery (Cardiothoracic Vascular Surgery)

## 2017-03-23 ENCOUNTER — Inpatient Hospital Stay (HOSPITAL_COMMUNITY)
Admission: RE | Admit: 2017-03-23 | Discharge: 2017-03-26 | DRG: 167 | Disposition: A | Discharge status: Home / Self Care | Payer: BLUE CROSS/BLUE SHIELD | Source: Ambulatory Visit | Attending: Thoracic Surgery (Cardiothoracic Vascular Surgery) | Admitting: Thoracic Surgery (Cardiothoracic Vascular Surgery)

## 2017-03-23 ENCOUNTER — Encounter (HOSPITAL_COMMUNITY): Payer: Self-pay

## 2017-03-23 ENCOUNTER — Inpatient Hospital Stay (HOSPITAL_COMMUNITY): Payer: BLUE CROSS/BLUE SHIELD

## 2017-03-23 DIAGNOSIS — K5900 Constipation, unspecified: Secondary | ICD-10-CM | POA: Diagnosis present

## 2017-03-23 DIAGNOSIS — Z902 Acquired absence of lung [part of]: Secondary | ICD-10-CM

## 2017-03-23 DIAGNOSIS — Z79899 Other long term (current) drug therapy: Secondary | ICD-10-CM

## 2017-03-23 DIAGNOSIS — I10 Essential (primary) hypertension: Secondary | ICD-10-CM | POA: Diagnosis not present

## 2017-03-23 DIAGNOSIS — Z803 Family history of malignant neoplasm of breast: Secondary | ICD-10-CM | POA: Diagnosis not present

## 2017-03-23 DIAGNOSIS — R011 Cardiac murmur, unspecified: Secondary | ICD-10-CM | POA: Diagnosis present

## 2017-03-23 DIAGNOSIS — J939 Pneumothorax, unspecified: Secondary | ICD-10-CM | POA: Diagnosis present

## 2017-03-23 DIAGNOSIS — Z8261 Family history of arthritis: Secondary | ICD-10-CM

## 2017-03-23 DIAGNOSIS — Z7951 Long term (current) use of inhaled steroids: Secondary | ICD-10-CM | POA: Diagnosis not present

## 2017-03-23 DIAGNOSIS — K219 Gastro-esophageal reflux disease without esophagitis: Secondary | ICD-10-CM | POA: Diagnosis present

## 2017-03-23 DIAGNOSIS — Z811 Family history of alcohol abuse and dependence: Secondary | ICD-10-CM

## 2017-03-23 DIAGNOSIS — J60 Coalworker's pneumoconiosis: Secondary | ICD-10-CM | POA: Diagnosis not present

## 2017-03-23 DIAGNOSIS — Z833 Family history of diabetes mellitus: Secondary | ICD-10-CM | POA: Diagnosis not present

## 2017-03-23 DIAGNOSIS — Z888 Allergy status to other drugs, medicaments and biological substances status: Secondary | ICD-10-CM

## 2017-03-23 DIAGNOSIS — Z4682 Encounter for fitting and adjustment of non-vascular catheter: Secondary | ICD-10-CM | POA: Diagnosis not present

## 2017-03-23 DIAGNOSIS — Z9889 Other specified postprocedural states: Secondary | ICD-10-CM

## 2017-03-23 DIAGNOSIS — Z885 Allergy status to narcotic agent status: Secondary | ICD-10-CM | POA: Diagnosis not present

## 2017-03-23 DIAGNOSIS — J45909 Unspecified asthma, uncomplicated: Secondary | ICD-10-CM | POA: Diagnosis not present

## 2017-03-23 DIAGNOSIS — Z801 Family history of malignant neoplasm of trachea, bronchus and lung: Secondary | ICD-10-CM

## 2017-03-23 DIAGNOSIS — R911 Solitary pulmonary nodule: Secondary | ICD-10-CM | POA: Diagnosis not present

## 2017-03-23 DIAGNOSIS — C3490 Malignant neoplasm of unspecified part of unspecified bronchus or lung: Secondary | ICD-10-CM

## 2017-03-23 DIAGNOSIS — E785 Hyperlipidemia, unspecified: Secondary | ICD-10-CM | POA: Diagnosis present

## 2017-03-23 DIAGNOSIS — Z823 Family history of stroke: Secondary | ICD-10-CM

## 2017-03-23 DIAGNOSIS — F418 Other specified anxiety disorders: Secondary | ICD-10-CM | POA: Diagnosis not present

## 2017-03-23 DIAGNOSIS — Z8249 Family history of ischemic heart disease and other diseases of the circulatory system: Secondary | ICD-10-CM

## 2017-03-23 DIAGNOSIS — C3411 Malignant neoplasm of upper lobe, right bronchus or lung: Secondary | ICD-10-CM | POA: Diagnosis not present

## 2017-03-23 DIAGNOSIS — Z87891 Personal history of nicotine dependence: Secondary | ICD-10-CM

## 2017-03-23 DIAGNOSIS — R918 Other nonspecific abnormal finding of lung field: Secondary | ICD-10-CM

## 2017-03-23 DIAGNOSIS — Z9851 Tubal ligation status: Secondary | ICD-10-CM

## 2017-03-23 DIAGNOSIS — M1990 Unspecified osteoarthritis, unspecified site: Secondary | ICD-10-CM | POA: Diagnosis present

## 2017-03-23 HISTORY — PX: VIDEO ASSISTED THORACOSCOPY (VATS)/WEDGE RESECTION: SHX6174

## 2017-03-23 HISTORY — DX: Malignant neoplasm of unspecified part of unspecified bronchus or lung: C34.90

## 2017-03-23 LAB — GLUCOSE, CAPILLARY: GLUCOSE-CAPILLARY: 133 mg/dL — AB (ref 65–99)

## 2017-03-23 LAB — PREPARE RBC (CROSSMATCH)

## 2017-03-23 SURGERY — VIDEO ASSISTED THORACOSCOPY (VATS)/WEDGE RESECTION
Anesthesia: General | Site: Chest | Laterality: Right

## 2017-03-23 MED ORDER — SCOPOLAMINE 1 MG/3DAYS TD PT72
MEDICATED_PATCH | TRANSDERMAL | Status: AC
  Filled 2017-03-23: qty 1

## 2017-03-23 MED ORDER — LORATADINE 10 MG PO TABS: 10.0000 mg | ORAL_TABLET | Freq: Every day | ORAL | Status: DC

## 2017-03-23 MED ORDER — ONDANSETRON HCL 4 MG/2ML IJ SOLN
INTRAMUSCULAR | Status: AC
  Filled 2017-03-23: qty 2

## 2017-03-23 MED ORDER — ONDANSETRON HCL 4 MG/2ML IJ SOLN: 4.0000 mg | Freq: Four times a day (QID) | INTRAMUSCULAR | Status: DC | PRN

## 2017-03-23 MED ORDER — LEVALBUTEROL HCL 0.63 MG/3ML IN NEBU
0.6300 mg | INHALATION_SOLUTION | Freq: Four times a day (QID) | RESPIRATORY_TRACT | Status: DC
  Administered 2017-03-23 (×2): 0.63 mg via RESPIRATORY_TRACT
  Filled 2017-03-23 (×2): qty 3

## 2017-03-23 MED ORDER — OXYCODONE HCL 5 MG PO TABS
ORAL_TABLET | ORAL | Status: AC
  Filled 2017-03-23: qty 2

## 2017-03-23 MED ORDER — LACTATED RINGERS IV SOLN
INTRAVENOUS | Status: DC | PRN
  Administered 2017-03-23: 07:00:00 via INTRAVENOUS

## 2017-03-23 MED ORDER — DIPHENHYDRAMINE HCL 50 MG/ML IJ SOLN: 12.5000 mg | Freq: Four times a day (QID) | INTRAMUSCULAR | Status: DC | PRN

## 2017-03-23 MED ORDER — FENTANYL CITRATE (PF) 250 MCG/5ML IJ SOLN
INTRAMUSCULAR | Status: AC
  Filled 2017-03-23: qty 5

## 2017-03-23 MED ORDER — ESMOLOL HCL 100 MG/10ML IV SOLN
INTRAVENOUS | Status: DC | PRN
  Administered 2017-03-23: 20 mg via INTRAVENOUS

## 2017-03-23 MED ORDER — DEXTROSE 5 % IV SOLN
1.5000 g | Freq: Two times a day (BID) | INTRAVENOUS | Status: AC
  Administered 2017-03-23 – 2017-03-24 (×2): 1.5 g via INTRAVENOUS
  Filled 2017-03-23 (×2): qty 1.5

## 2017-03-23 MED ORDER — ONDANSETRON HCL 4 MG/2ML IJ SOLN
INTRAMUSCULAR | Status: DC | PRN
Start: 2017-03-23 — End: 2017-03-23
  Administered 2017-03-23: 4 mg via INTRAVENOUS

## 2017-03-23 MED ORDER — POTASSIUM CHLORIDE 10 MEQ/50ML IV SOLN
10.0000 meq | Freq: Every day | INTRAVENOUS | Status: DC | PRN
  Administered 2017-03-25 (×3): 10 meq via INTRAVENOUS
  Filled 2017-03-23 (×4): qty 50

## 2017-03-23 MED ORDER — DULOXETINE HCL 60 MG PO CPEP
60.0000 mg | ORAL_CAPSULE | Freq: Every day | ORAL | Status: DC
  Administered 2017-03-24 – 2017-03-26 (×3): 60 mg via ORAL
  Filled 2017-03-23 (×3): qty 1

## 2017-03-23 MED ORDER — PROPOFOL 10 MG/ML IV BOLUS
INTRAVENOUS | Status: DC | PRN
  Administered 2017-03-23: 150 mg via INTRAVENOUS

## 2017-03-23 MED ORDER — FENTANYL CITRATE (PF) 250 MCG/5ML IJ SOLN
INTRAMUSCULAR | Status: DC | PRN
  Administered 2017-03-23 (×2): 50 ug via INTRAVENOUS
  Administered 2017-03-23: 100 ug via INTRAVENOUS
  Administered 2017-03-23 (×6): 50 ug via INTRAVENOUS

## 2017-03-23 MED ORDER — BUPIVACAINE 0.5 % ON-Q PUMP SINGLE CATH 400 ML
INJECTION | Status: AC | PRN
  Administered 2017-03-23: 400 mL

## 2017-03-23 MED ORDER — BISACODYL 5 MG PO TBEC
10.0000 mg | DELAYED_RELEASE_TABLET | Freq: Every day | ORAL | Status: DC
  Administered 2017-03-23 – 2017-03-25 (×3): 10 mg via ORAL
  Filled 2017-03-23 (×4): qty 2

## 2017-03-23 MED ORDER — 0.9 % SODIUM CHLORIDE (POUR BTL) OPTIME
TOPICAL | Status: DC | PRN
  Administered 2017-03-23: 2000 mL

## 2017-03-23 MED ORDER — FENTANYL CITRATE (PF) 100 MCG/2ML IJ SOLN
25.0000 ug | INTRAMUSCULAR | Status: DC | PRN
  Administered 2017-03-23 (×2): 50 ug via INTRAVENOUS

## 2017-03-23 MED ORDER — METOPROLOL SUCCINATE ER 50 MG PO TB24
75.0000 mg | ORAL_TABLET | Freq: Every day | ORAL | Status: DC
  Filled 2017-03-23: qty 1

## 2017-03-23 MED ORDER — ORAL CARE MOUTH RINSE
15.0000 mL | Freq: Two times a day (BID) | OROMUCOSAL | Status: DC
  Administered 2017-03-23 – 2017-03-24 (×4): 15 mL via OROMUCOSAL

## 2017-03-23 MED ORDER — MIDAZOLAM HCL 2 MG/2ML IJ SOLN
INTRAMUSCULAR | Status: DC | PRN
  Administered 2017-03-23 (×2): 1 mg via INTRAVENOUS

## 2017-03-23 MED ORDER — BUPIVACAINE HCL (PF) 0.5 % IJ SOLN
INTRAMUSCULAR | Status: AC
  Filled 2017-03-23: qty 30

## 2017-03-23 MED ORDER — ONDANSETRON HCL 4 MG/2ML IJ SOLN
4.0000 mg | Freq: Once | INTRAMUSCULAR | Status: AC | PRN
  Administered 2017-03-23: 4 mg via INTRAVENOUS

## 2017-03-23 MED ORDER — HYDROMORPHONE 1 MG/ML IV SOLN
INTRAVENOUS | Status: DC
  Administered 2017-03-23: 0 mg via INTRAVENOUS
  Administered 2017-03-23: 11:00:00 via INTRAVENOUS
  Administered 2017-03-23 – 2017-03-24 (×2): 0.3 mg via INTRAVENOUS
  Administered 2017-03-24: 0.6 mg via INTRAVENOUS
  Administered 2017-03-24: 0 mg via INTRAVENOUS
  Administered 2017-03-24: 0.3 mg via INTRAVENOUS
  Administered 2017-03-24: 0 mg via INTRAVENOUS
  Administered 2017-03-24 – 2017-03-25 (×2): 0.3 mg via INTRAVENOUS
  Administered 2017-03-25: 0 mg via INTRAVENOUS

## 2017-03-23 MED ORDER — PHENYLEPHRINE HCL 10 MG/ML IJ SOLN
INTRAMUSCULAR | Status: DC | PRN
  Administered 2017-03-23: 80 ug via INTRAVENOUS

## 2017-03-23 MED ORDER — LABETALOL HCL 5 MG/ML IV SOLN
INTRAVENOUS | Status: DC | PRN
  Administered 2017-03-23: 5 mg via INTRAVENOUS

## 2017-03-23 MED ORDER — ACETAMINOPHEN 160 MG/5ML PO SOLN: 1000.0000 mg | Freq: Four times a day (QID) | ORAL | Status: DC

## 2017-03-23 MED ORDER — DEXTROSE-NACL 5-0.9 % IV SOLN
INTRAVENOUS | Status: DC
  Administered 2017-03-23 – 2017-03-24 (×3): via INTRAVENOUS

## 2017-03-23 MED ORDER — PROPOFOL 10 MG/ML IV BOLUS
INTRAVENOUS | Status: AC
  Filled 2017-03-23: qty 20

## 2017-03-23 MED ORDER — DEXAMETHASONE SODIUM PHOSPHATE 10 MG/ML IJ SOLN
INTRAMUSCULAR | Status: DC | PRN
  Administered 2017-03-23: 10 mg via INTRAVENOUS

## 2017-03-23 MED ORDER — SCOPOLAMINE 1 MG/3DAYS TD PT72
MEDICATED_PATCH | TRANSDERMAL | Status: DC | PRN
  Administered 2017-03-23: 1 via TRANSDERMAL

## 2017-03-23 MED ORDER — EPHEDRINE SULFATE 50 MG/ML IJ SOLN
INTRAMUSCULAR | Status: DC | PRN
  Administered 2017-03-23 (×2): 5 mg via INTRAVENOUS

## 2017-03-23 MED ORDER — SODIUM CHLORIDE 0.9 % IV SOLN: Freq: Once | INTRAVENOUS | Status: DC

## 2017-03-23 MED ORDER — METOCLOPRAMIDE HCL 5 MG/ML IJ SOLN
10.0000 mg | Freq: Four times a day (QID) | INTRAMUSCULAR | Status: AC
  Administered 2017-03-23 – 2017-03-24 (×4): 10 mg via INTRAVENOUS
  Filled 2017-03-23 (×4): qty 2

## 2017-03-23 MED ORDER — SUGAMMADEX SODIUM 200 MG/2ML IV SOLN
INTRAVENOUS | Status: DC | PRN
  Administered 2017-03-23: 348.8 mg via INTRAVENOUS

## 2017-03-23 MED ORDER — SODIUM CHLORIDE 0.9% FLUSH: 9.0000 mL | INTRAVENOUS | Status: DC | PRN

## 2017-03-23 MED ORDER — LIDOCAINE 2% (20 MG/ML) 5 ML SYRINGE
INTRAMUSCULAR | Status: DC | PRN
  Administered 2017-03-23: 100 mg via INTRAVENOUS

## 2017-03-23 MED ORDER — OXYCODONE HCL 5 MG PO TABS
5.0000 mg | ORAL_TABLET | ORAL | Status: DC | PRN
  Administered 2017-03-23 – 2017-03-26 (×3): 10 mg via ORAL
  Filled 2017-03-23 (×2): qty 2

## 2017-03-23 MED ORDER — LEVALBUTEROL HCL 0.63 MG/3ML IN NEBU: 0.6300 mg | INHALATION_SOLUTION | Freq: Four times a day (QID) | RESPIRATORY_TRACT | Status: DC | PRN

## 2017-03-23 MED ORDER — SENNOSIDES-DOCUSATE SODIUM 8.6-50 MG PO TABS
1.0000 | ORAL_TABLET | Freq: Every day | ORAL | Status: DC
  Administered 2017-03-23 – 2017-03-24 (×2): 1 via ORAL
  Filled 2017-03-23 (×2): qty 1

## 2017-03-23 MED ORDER — LORATADINE 10 MG PO TABS
10.0000 mg | ORAL_TABLET | Freq: Every day | ORAL | Status: DC
  Administered 2017-03-24 – 2017-03-26 (×3): 10 mg via ORAL
  Filled 2017-03-23 (×3): qty 1

## 2017-03-23 MED ORDER — PANTOPRAZOLE SODIUM 40 MG PO TBEC
40.0000 mg | DELAYED_RELEASE_TABLET | Freq: Every day | ORAL | Status: DC
  Administered 2017-03-23 – 2017-03-26 (×4): 40 mg via ORAL
  Filled 2017-03-23 (×5): qty 1

## 2017-03-23 MED ORDER — BUPIVACAINE HCL (PF) 0.5 % IJ SOLN
INTRAMUSCULAR | Status: DC | PRN
  Administered 2017-03-23: 30 mL

## 2017-03-23 MED ORDER — ACETAMINOPHEN 500 MG PO TABS
1000.0000 mg | ORAL_TABLET | Freq: Four times a day (QID) | ORAL | Status: DC
  Administered 2017-03-23 – 2017-03-26 (×11): 1000 mg via ORAL
  Filled 2017-03-23 (×12): qty 2

## 2017-03-23 MED ORDER — TRAMADOL HCL 50 MG PO TABS: 50.0000 mg | ORAL_TABLET | Freq: Four times a day (QID) | ORAL | Status: DC | PRN

## 2017-03-23 MED ORDER — ENOXAPARIN SODIUM 40 MG/0.4ML ~~LOC~~ SOLN
40.0000 mg | Freq: Every day | SUBCUTANEOUS | Status: DC
  Administered 2017-03-23 – 2017-03-25 (×3): 40 mg via SUBCUTANEOUS
  Filled 2017-03-23 (×3): qty 0.4

## 2017-03-23 MED ORDER — HYDROMORPHONE 1 MG/ML IV SOLN
INTRAVENOUS | Status: AC
  Administered 2017-03-23: 0 mg
  Filled 2017-03-23: qty 25

## 2017-03-23 MED ORDER — FLUTICASONE PROPIONATE 50 MCG/ACT NA SUSP
2.0000 | Freq: Every day | NASAL | Status: DC | PRN
  Filled 2017-03-23: qty 16

## 2017-03-23 MED ORDER — MIDAZOLAM HCL 2 MG/2ML IJ SOLN
INTRAMUSCULAR | Status: AC
  Filled 2017-03-23: qty 2

## 2017-03-23 MED ORDER — OLOPATADINE HCL 0.1 % OP SOLN
1.0000 [drp] | Freq: Two times a day (BID) | OPHTHALMIC | Status: DC
  Administered 2017-03-24: 1 [drp] via OPHTHALMIC
  Filled 2017-03-23: qty 5

## 2017-03-23 MED ORDER — WHITE PETROLATUM EX OINT
TOPICAL_OINTMENT | CUTANEOUS | Status: AC
  Administered 2017-03-23: 15:00:00
  Filled 2017-03-23: qty 28.35

## 2017-03-23 MED ORDER — NALOXONE HCL 0.4 MG/ML IJ SOLN
0.4000 mg | INTRAMUSCULAR | Status: DC | PRN
  Filled 2017-03-23: qty 1

## 2017-03-23 MED ORDER — ROCURONIUM BROMIDE 10 MG/ML (PF) SYRINGE
PREFILLED_SYRINGE | INTRAVENOUS | Status: DC | PRN
  Administered 2017-03-23: 50 mg via INTRAVENOUS
  Administered 2017-03-23 (×2): 20 mg via INTRAVENOUS

## 2017-03-23 MED ORDER — BUPIVACAINE 0.5 % ON-Q PUMP SINGLE CATH 400 ML
400.0000 mL | INJECTION | Status: DC
  Filled 2017-03-23: qty 400

## 2017-03-23 MED ORDER — FENTANYL CITRATE (PF) 100 MCG/2ML IJ SOLN
INTRAMUSCULAR | Status: AC
  Filled 2017-03-23: qty 2

## 2017-03-23 MED ORDER — DIPHENHYDRAMINE HCL 12.5 MG/5ML PO ELIX: 12.5000 mg | ORAL_SOLUTION | Freq: Four times a day (QID) | ORAL | Status: DC | PRN

## 2017-03-23 SURGICAL SUPPLY — 93 items
BENZOIN TINCTURE PRP APPL 2/3 (GAUZE/BANDAGES/DRESSINGS) ×2 IMPLANT
CANISTER SUCT 3000ML PPV (MISCELLANEOUS) ×4 IMPLANT
CATH KIT ON Q 5IN SLV (PAIN MANAGEMENT) IMPLANT
CATH KIT ON-Q SILVERSOAK 5IN (CATHETERS) ×2 IMPLANT
CATH THORACIC 28FR (CATHETERS) ×2 IMPLANT
CATH THORACIC 36FR (CATHETERS) IMPLANT
CATH THORACIC 36FR RT ANG (CATHETERS) IMPLANT
CLIP VESOCCLUDE MED 6/CT (CLIP) ×2 IMPLANT
CONN ST 1/4X3/8  BEN (MISCELLANEOUS)
CONN ST 1/4X3/8 BEN (MISCELLANEOUS) IMPLANT
CONN Y 3/8X3/8X3/8  BEN (MISCELLANEOUS)
CONN Y 3/8X3/8X3/8 BEN (MISCELLANEOUS) IMPLANT
CONT SPEC 4OZ CLIKSEAL STRL BL (MISCELLANEOUS) ×14 IMPLANT
COVER SURGICAL LIGHT HANDLE (MISCELLANEOUS) ×2 IMPLANT
DERMABOND ADHESIVE PROPEN (GAUZE/BANDAGES/DRESSINGS) ×1
DERMABOND ADVANCED (GAUZE/BANDAGES/DRESSINGS) ×1
DERMABOND ADVANCED .7 DNX12 (GAUZE/BANDAGES/DRESSINGS) ×1 IMPLANT
DERMABOND ADVANCED .7 DNX6 (GAUZE/BANDAGES/DRESSINGS) ×1 IMPLANT
DRAIN CHANNEL 28F RND 3/8 FF (WOUND CARE) IMPLANT
DRAIN CHANNEL 32F RND 10.7 FF (WOUND CARE) IMPLANT
DRAPE LAPAROSCOPIC ABDOMINAL (DRAPES) ×2 IMPLANT
DRAPE WARM FLUID 44X44 (DRAPE) ×2 IMPLANT
ELECT BLADE 6.5 EXT (BLADE) ×4 IMPLANT
ELECT REM PT RETURN 9FT ADLT (ELECTROSURGICAL) ×2
ELECTRODE REM PT RTRN 9FT ADLT (ELECTROSURGICAL) ×1 IMPLANT
GAUZE SPONGE 4X4 12PLY STRL (GAUZE/BANDAGES/DRESSINGS) IMPLANT
GAUZE SPONGE 4X4 12PLY STRL LF (GAUZE/BANDAGES/DRESSINGS) ×2 IMPLANT
GLOVE BIO SURGEON STRL SZ 6 (GLOVE) ×4 IMPLANT
GLOVE BIO SURGEON STRL SZ 6.5 (GLOVE) ×6 IMPLANT
GLOVE BIOGEL PI IND STRL 6.5 (GLOVE) ×1 IMPLANT
GLOVE BIOGEL PI INDICATOR 6.5 (GLOVE) ×1
GLOVE SURG SIGNA 7.5 PF LTX (GLOVE) ×4 IMPLANT
GOWN STRL REUS W/ TWL LRG LVL3 (GOWN DISPOSABLE) ×2 IMPLANT
GOWN STRL REUS W/ TWL XL LVL3 (GOWN DISPOSABLE) ×2 IMPLANT
GOWN STRL REUS W/TWL LRG LVL3 (GOWN DISPOSABLE) ×2
GOWN STRL REUS W/TWL XL LVL3 (GOWN DISPOSABLE) ×2
HANDLE STAPLE ENDO GIA SHORT (STAPLE)
HEMOSTAT SURGICEL 2X14 (HEMOSTASIS) IMPLANT
KIT BASIN OR (CUSTOM PROCEDURE TRAY) ×2 IMPLANT
KIT ROOM TURNOVER OR (KITS) ×2 IMPLANT
KIT SUCTION CATH 14FR (SUCTIONS) ×4 IMPLANT
NS IRRIG 1000ML POUR BTL (IV SOLUTION) ×6 IMPLANT
PACK CHEST (CUSTOM PROCEDURE TRAY) ×2 IMPLANT
PAD ARMBOARD 7.5X6 YLW CONV (MISCELLANEOUS) ×4 IMPLANT
POUCH ENDO CATCH II 15MM (MISCELLANEOUS) IMPLANT
POUCH SPECIMEN RETRIEVAL 10MM (ENDOMECHANICALS) ×2 IMPLANT
RELOAD STAPLER GOLD 60MM (STAPLE) ×4 IMPLANT
SCISSORS ENDO CVD 5DCS (MISCELLANEOUS) IMPLANT
SEALANT PROGEL (MISCELLANEOUS) IMPLANT
SEALANT SURG COSEAL 4ML (VASCULAR PRODUCTS) IMPLANT
SEALANT SURG COSEAL 8ML (VASCULAR PRODUCTS) IMPLANT
SHEARS HARMONIC HDI 20CM (ELECTROSURGICAL) IMPLANT
SOLUTION ANTI FOG 6CC (MISCELLANEOUS) ×2 IMPLANT
SPECIMEN JAR MEDIUM (MISCELLANEOUS) ×2 IMPLANT
SPONGE INTESTINAL PEANUT (DISPOSABLE) ×2 IMPLANT
SPONGE TONSIL 1 RF SGL (DISPOSABLE) ×2 IMPLANT
STAPLE ECHEON FLEX 60 POW ENDO (STAPLE) ×2 IMPLANT
STAPLER ENDO GIA 12MM SHORT (STAPLE) IMPLANT
STAPLER RELOAD GOLD 60MM (STAPLE) ×8
SUT PROLENE 4 0 RB 1 (SUTURE)
SUT PROLENE 4-0 RB1 .5 CRCL 36 (SUTURE) IMPLANT
SUT SILK  1 MH (SUTURE) ×2
SUT SILK 1 MH (SUTURE) ×2 IMPLANT
SUT SILK 1 TIES 10X30 (SUTURE) ×2 IMPLANT
SUT SILK 2 0 SH (SUTURE) IMPLANT
SUT SILK 2 0SH CR/8 30 (SUTURE) ×2 IMPLANT
SUT SILK 3 0 SH 30 (SUTURE) IMPLANT
SUT SILK 3 0SH CR/8 30 (SUTURE) ×2 IMPLANT
SUT VIC AB 0 CTX 27 (SUTURE) IMPLANT
SUT VIC AB 1 CTX 27 (SUTURE) ×2 IMPLANT
SUT VIC AB 1 CTX 36 (SUTURE) ×1
SUT VIC AB 1 CTX36XBRD ANBCTR (SUTURE) ×1 IMPLANT
SUT VIC AB 2-0 CT1 27 (SUTURE)
SUT VIC AB 2-0 CT1 TAPERPNT 27 (SUTURE) IMPLANT
SUT VIC AB 2-0 CTX 27 (SUTURE) ×2 IMPLANT
SUT VIC AB 2-0 CTX 36 (SUTURE) ×2 IMPLANT
SUT VIC AB 3-0 MH 27 (SUTURE) IMPLANT
SUT VIC AB 3-0 SH 27 (SUTURE)
SUT VIC AB 3-0 SH 27X BRD (SUTURE) IMPLANT
SUT VIC AB 3-0 X1 27 (SUTURE) ×2 IMPLANT
SUT VICRYL 0 UR6 27IN ABS (SUTURE) IMPLANT
SUT VICRYL 2 TP 1 (SUTURE) IMPLANT
SWAB CULTURE ESWAB REG 1ML (MISCELLANEOUS) IMPLANT
SYSTEM SAHARA CHEST DRAIN ATS (WOUND CARE) ×2 IMPLANT
TAPE CLOTH SURG 4X10 WHT LF (GAUZE/BANDAGES/DRESSINGS) ×2 IMPLANT
TIP APPLICATOR SPRAY EXTEND 16 (VASCULAR PRODUCTS) IMPLANT
TOWEL GREEN STERILE (TOWEL DISPOSABLE) ×2 IMPLANT
TOWEL GREEN STERILE FF (TOWEL DISPOSABLE) ×2 IMPLANT
TRAY FOLEY W/METER SILVER 16FR (SET/KITS/TRAYS/PACK) ×2 IMPLANT
TROCAR XCEL BLADELESS 5X75MML (TROCAR) ×2 IMPLANT
TROCAR XCEL NON-BLD 5MMX100MML (ENDOMECHANICALS) IMPLANT
TUNNELER SHEATH ON-Q 11GX8 DSP (PAIN MANAGEMENT) ×2 IMPLANT
WATER STERILE IRR 1000ML POUR (IV SOLUTION) ×4 IMPLANT

## 2017-03-23 NOTE — Interval H&P Note (Signed)
History and Physical Interval Note:  03/23/2017 7:22 AM  Regina Black  has presented today for surgery, with the diagnosis of BILATERAL LUNG NODULES  The various methods of treatment have been discussed with the patient and family. After consideration of risks, benefits and other options for treatment, the patient has consented to  Procedure(s): VIDEO ASSISTED THORACOSCOPY (VATS)/WEDGE RESECTION (Right) as a surgical intervention .  The patient's history has been reviewed, patient examined, no change in status, stable for surgery.  I have reviewed the patient's chart and labs.  Questions were answered to the patient's satisfaction.     [name]

## 2017-03-23 NOTE — Anesthesia Preprocedure Evaluation (Signed)
Anesthesia Evaluation  Patient identified by MRN, date of birth, ID band Patient awake    Reviewed: Allergy & Precautions, NPO status , Patient's Chart, lab work & pertinent test results  Airway Mallampati: II  TM Distance: >3 FB Neck ROM: Full    Dental  (+) Teeth Intact, Dental Advisory Given   Pulmonary former smoker,    breath sounds clear to auscultation       Cardiovascular hypertension,  Rhythm:Regular Rate:Normal     Neuro/Psych    GI/Hepatic   Endo/Other    Renal/GU      Musculoskeletal   Abdominal   Peds  Hematology   Anesthesia Other Findings   Reproductive/Obstetrics                             Anesthesia Physical Anesthesia Plan  ASA: III  Anesthesia Plan: General   Post-op Pain Management:    Induction: Intravenous  PONV Risk Score and Plan: 1 and Ondansetron and Dexamethasone  Airway Management Planned: Double Lumen EBT  Additional Equipment: CVP and Arterial line  Intra-op Plan:   Post-operative Plan: Extubation in OR  Informed Consent: I have reviewed the patients History and Physical, chart, labs and discussed the procedure including the risks, benefits and alternatives for the proposed anesthesia with the patient or authorized representative who has indicated his/her understanding and acceptance.   Dental advisory given  Plan Discussed with: CRNA and Anesthesiologist  Anesthesia Plan Comments:         Anesthesia Quick Evaluation

## 2017-03-23 NOTE — Brief Op Note (Addendum)
03/23/2017  9:43 AM  PATIENT:  Mannie Stabile  61 y.o. female  PRE-OPERATIVE DIAGNOSIS:  BILATERAL LUNG NODULES  POST-OPERATIVE DIAGNOSIS:  ADENOCARCINOMA RIGHT UPPER LOBE  PROCEDURE:  Procedure(s):  VIDEO ASSISTED THORACOSCOPY  -Wedge Resection RUL -Lymph Node Dissection -Insertion of ON-Q Anesthetic Catheter  SURGEON:  Surgeon(s) and Role:    * [name], MD - Primary  PHYSICIAN ASSISTANT: Ellwood Handler PA-C  ANESTHESIA:   general  EBL:  Total I/O In: 700 [I.V.:700] Out: 150 [Urine:150]  BLOOD ADMINISTERED:none  DRAINS: 28 Straight Chest Tube   LOCAL MEDICATIONS USED:  MARCAINE     SPECIMEN:  Source of Specimen:  Wedge Resection RUL, Lymph Node 4, 7, 10   DISPOSITION OF SPECIMEN:  PATHOLOGY  COUNTS:  YES  TOURNIQUET:  * No tourniquets in log *  DICTATION: .Dragon Dictation  PLAN OF CARE: Admit to inpatient   PATIENT DISPOSITION:  ICU - extubated and stable.   Delay start of Pharmacological VTE agent (>24hrs) due to surgical blood loss or risk of bleeding: no

## 2017-03-23 NOTE — H&P (View-Only) (Signed)
PCP is [name], DO Referring Provider is Juanito Doom, MD  Chief Complaint  Patient presents with  . Lung Lesion    CT CHEST 02/17/17...negative ENB 02/2016    HPI: Regina Black is a 61 year old woman sent for consultation regarding multiple groundglass lung nodules  Regina Black is a 61 year old woman with a past medical history of hypertension, hyperlipidemia, heart murmur, asthma, gastroesophageal reflux, hypertension, hyperlipidemia, thyroid nodule, "prediabetes" and multiple lung nodules. Her lung nodules were first noted in 2016. He presented at that time with chest pain, shortness of breath, and nausea. Workup included a CT angiogram at [facility] to rule out pulmonary embolus. She was found to have multiple groundglass opacities. She had a repeat scan in August 2017 which again showed multiple groundglass opacities bilaterally. She had navigational bronchoscopy in 2017. Biopsies from the right upper lobe and left upper lobe but it showed inflammation. No tumor was seen. Radiographic follow-up was recommended.  She recently had a follow-up CT scan, which showed no significant change in the nodules, but did put in the report that this could be low-grade adenocarcinomas. This made her extremely anxious. After discussion with Dr. Lake Bells she requested a thoracic surgical opinion regarding resection.  Of note her mother did have surgery for lung cancer.  She smokes 2 packs per day prior to quitting 18 years ago (50-pack-year history). She does not have any chest pain, pressure, or tightness. She denies shortness of breath, cough, and wheezing. She has not traveled to the desert or out of the country. She does not have any known exposures to TB or mold. Her appetite is good and her weight is stable. Zubrod Score: At the time of surgery this patient's most appropriate activity status/level should be described as: [x]     0    Normal activity, no symptoms []     1    Restricted in  physical strenuous activity but ambulatory, able to do out light work []     2    Ambulatory and capable of self care, unable to do work activities, up and about >50 % of waking hours                              []     3    Only limited self care, in bed greater than 50% of waking hours []     4    Completely disabled, no self care, confined to bed or chair []     5    Moribund  Past Medical History:  Diagnosis Date  . Allergy   . Anxiety   . Arrhythmia   . Asthma   . Blood transfusion without reported diagnosis   . Chicken pox   . Depression   . Diverticulosis 2010   Mild left sided by EGD/Colonoscopy Dr. Alice Reichert  . Eczema   . Endometriosis   . Fatty liver   . GERD (gastroesophageal reflux disease)   . Headache   . Heart murmur   . Hiatal hernia 2010   "small"  . History of cardiovascular stress test 04/2013   negative per OV note from Dr. Marijo File  . History of echocardiogram 04/2013   normal study; EF 75% per Dr. Marijo File  . History of EKG 2016   NSR with PVC, Dr. Marijo File  . History of Holter monitoring 2014   palpitations/pvc  . Hyperlipidemia   . Hypertension   . Hypokalemia   . Internal  hemorrhoid 2010   small  . Osteoarthritis   . PCR DNA positive for HSV1   . PONV (postoperative nausea and vomiting)   . Prediabetes   . PVC (premature ventricular contraction) 2014   palpitations  . Thyroid nodule   . Vertigo   . Vitamin D deficiency     Past Surgical History:  Procedure Laterality Date  . BREAST SURGERY     reduction  . COLONOSCOPY  2010  . ESOPHAGOGASTRODUODENOSCOPY  2010  . EXPLORATORY LAPAROTOMY     endometriosis  . LASIK    . RECTAL SURGERY    . RECTOCELE REPAIR  07/2014  . thyroid nodule asp  01/11/2013  . TONSILLECTOMY AND ADENOIDECTOMY    . TUBAL LIGATION    . VIDEO BRONCHOSCOPY WITH ENDOBRONCHIAL NAVIGATION N/A 03/05/2016   Procedure: VIDEO BRONCHOSCOPY WITH ENDOBRONCHIAL NAVIGATION;  Surgeon: Collene Gobble, MD;  Location: [facility] OR;  Service:  Thoracic;  Laterality: N/A;    Family History  Problem Relation Age of Onset  . Lung cancer Mother   . Heart disease Mother   . Alcohol abuse Father   . Heart disease Father   . Breast cancer Paternal Aunt 63  . Heart disease Maternal Grandmother   . Arthritis Maternal Grandmother   . Arthritis Maternal Grandfather   . Heart disease Maternal Grandfather   . Hearing loss Maternal Grandfather   . Stroke Maternal Grandfather   . Diabetes Paternal Grandmother   . Hearing loss Paternal Grandfather     Social History Social History  Substance Use Topics  . Smoking status: Former Smoker    Packs/day: 2.00    Years: 25.00    Types: Cigarettes    Quit date: 06/09/1998  . Smokeless tobacco: Never Used  . Alcohol use 1.2 oz/week    2 Glasses of wine per week    Current Outpatient Prescriptions  Medication Sig Dispense Refill  . B Complex-Biotin-FA (VITAMIN B50 COMPLEX PO) Take by mouth.    . DULoxetine (CYMBALTA) 60 MG capsule Take 1 capsule (60 mg total) by mouth daily. Needs office visit prior to anymore refills 90 capsule 1  . fluticasone (FLONASE) 50 MCG/ACT nasal spray Place 2 sprays into both nostrils daily as needed (allergies/congestion).    . hydrochlorothiazide (HYDRODIURIL) 25 MG tablet Take 25 mg by mouth daily.    Marland Kitchen levocetirizine (XYZAL) 5 MG tablet Take 1 tablet (5 mg total) by mouth every evening. 90 tablet 3  . lisinopril (PRINIVIL,ZESTRIL) 40 MG tablet Take 40 mg by mouth daily.    . metoprolol succinate (TOPROL-XL) 25 MG 24 hr tablet Take 3 tablets (75 mg total) by mouth daily. Take with or immediately following a meal. 90 tablet 0  . Olopatadine HCl 0.2 % SOLN Apply 1 drop to eye daily. 1 drop each eye daily 1 Bottle 0  . omeprazole (PRILOSEC) 20 MG capsule Take 20 mg by mouth daily.     Marland Kitchen penciclovir (DENAVIR) 1 % cream Apply 1 application topically every 4 (four) hours as needed (applied to lip area for recurrent cold sores).     . valACYclovir (VALTREX) 1000 MG  tablet Take 1 g by mouth 2 (two) times daily as needed. For fever blisters/cold sores.  0   No current facility-administered medications for this visit.     Allergies  Allergen Reactions  . Butorphanol Other (See Comments)    syncope  . Codeine Nausea And Vomiting    Review of Systems  Constitutional: Negative for activity change,  appetite change, chills, fatigue, fever and unexpected weight change.  HENT: Negative for trouble swallowing and voice change.   Respiratory: Negative for cough, shortness of breath and wheezing.   Cardiovascular: Negative for chest pain and leg swelling.  Gastrointestinal: Positive for abdominal pain (Frequent heartburn if not on medication). Negative for nausea and vomiting.  Genitourinary: Negative for difficulty urinating and dysuria.  Musculoskeletal: Negative for arthralgias and myalgias.  Neurological: Negative for dizziness, seizures and syncope.  Hematological: Negative for adenopathy. Does not bruise/bleed easily.  All other systems reviewed and are negative.   BP (!) 156/85 (BP Location: Right Arm, Patient Position: Sitting, Cuff Size: Large)   Pulse 84   Resp 16   Ht 5\' 4"  (1.626 m)   Wt 190 lb (86.2 kg)   SpO2 96% Comment: ON RA  BMI 32.61 kg/m  Physical Exam  Constitutional: She is oriented to person, place, and time. She appears well-developed and well-nourished. No distress.  HENT:  Head: Normocephalic and atraumatic.  Mouth/Throat: No oropharyngeal exudate.  Eyes: Conjunctivae and EOM are normal. No scleral icterus.  Neck: Neck supple. No thyromegaly present.  Cardiovascular: Normal rate, regular rhythm, normal heart sounds and intact distal pulses.  Exam reveals no gallop and no friction rub.   No murmur heard. Pulmonary/Chest: Effort normal and breath sounds normal. No respiratory distress. She has no wheezes.  Abdominal: Soft. She exhibits no distension. There is no tenderness.  Musculoskeletal: She exhibits no edema or  deformity.  Lymphadenopathy:    She has no cervical adenopathy.  Neurological: She is alert and oriented to person, place, and time. No cranial nerve deficit.  Motor intact  Skin: Skin is warm and dry.  Vitals reviewed.    Diagnostic Tests: CT CHEST WITHOUT CONTRAST  TECHNIQUE: Multidetector CT imaging of the chest was performed following the standard protocol without IV contrast.  COMPARISON:  07/18/2016  FINDINGS: Cardiovascular: Normal heart size. Mild aortic atherosclerosis. Calcification within the LAD coronary artery identified.  Mediastinum/Nodes: 1.5 cm nodule arises from the left lobe of the thyroid gland. The trachea appears patent and is midline. Normal appearance of the esophagus. No enlarged mediastinal or hilar lymph nodes. No axillary or supraclavicular adenopathy.  Lungs/Pleura: No pleural effusion identified. Ground-glass attenuating nodule within the right upper lobe measures 1.4 cm, image 35 of series 3. Unchanged in size from 11/12/2014. This has a central solid component measuring 4 mm. Ground-glass attenuating nodule in the left upper lobe measures 1.4 cm, image 38 of series 3. Also unchanged from 07/18/2016, but is increased from 7 mm on 11/12/2014. Index right upper lobe lung nodule appears ground-glass measuring 8 mm. Stable from prior study. Perifissural ground-glass nodule in the left upper lobe measures 1 cm and is unchanged from 11/12/2014, image 53 of series 3.  Upper Abdomen: No acute abnormality.  Musculoskeletal: No chest wall mass or suspicious bone lesions identified.  IMPRESSION: 1. Stable appearance of bilateral ground-glass and part solid nodules compared with 07/18/2016. Suspicious for multifocal pulmonary adenocarcinoma. When compared with study from 11/12/2014 the 1.4 cm left upper lobe ground-glass nodule has increased in size from 7 mm. The other nodules have remained stable since 11/12/2014. 2.  Aortic  Atherosclerosis (ICD10-I70.0). 3. LAD coronary artery calcifications.   Electronically Signed   By: Kerby Moors M.D.   On: 02/18/2017 09:17 I personally reviewed the CT chest compared to her previous films from earlier in 2018 and from 2016. I concur with the findings noted above  Pulmonary function testing 04/08/2016  FVC 2.9 (90%) FEV1 2.0 (80%)  Impression: Regina Black is a 61 year old woman with a remote history of tobacco but has multiple groundglass opacities in both lungs, primarily in the upper lobes. Differential diagnosis includes multifocal adenocarcinoma, as well as infectious and inflammatory conditions. I did explain to her that a single negative bronchoscopic biopsy does not rule out the possibility of a low-grade adenocarcinoma.  We discussed multiple potential options including continued radiographic follow-up, CT guided biopsy, repeat navigational bronchoscopy, and VATS for wedge resection. We discussed the advantages and disadvantages of each of these approaches.  Reviewing the films I am concerned this could be low-grade adenocarcinoma based on the appearance of the larger lesion both upper lobes. The one on the right has a pleural tail and the one on the left has increased in size.  I did discuss what would be involved with surgical resection. The proposed procedure would be right VATS for wedge resection and possible lymph node sampling. Given the multifocal nature of the disease I do not think that an anatomic resection would be particularly beneficial for her. If this did turn out to be low-grade adenocarcinoma that would provide sufficient tissue for molecular testing. If it turns out to be infectious or inflammatory in nature, we would have adequate tissue for cultures and an excisional wedge resection would be more likely to yield a definitive diagnosis.  I described the general nature of VATS for wedge resection, including the need for general anesthesia, the  incisions to be used, the use of a drainage tube postoperatively, the expected hospital stay, and the overall recovery. She understands the indications, risks, benefits, and alternatives. She understands the risks include, but are not limited to death, MI, DVT, PE, bleeding, possible need for transfusion, infection, prolonged air leak, irregular heart rhythms, as well as the possibility of other unforeseeable complications.  She understands that surgery alone would not be cured of due to the multifocal nature of the disease.  She understands that there is nothing that controls immediate intervention and the other options are also reasonable alternatives.  Plan:  She wishes to talk these issues over with her husband for making a decision and will call me back after she has done so.  [name], MD [facility] [phone]

## 2017-03-23 NOTE — Anesthesia Procedure Notes (Signed)
Arterial Line Insertion Start/End10/15/2018 6:45 AM, 03/23/2017 6:55 AM Performed by: Tressia Miners LEFFEW, CRNA  Preanesthetic checklist: patient identified, IV checked, site marked, risks and benefits discussed, surgical consent, monitors and equipment checked, pre-op evaluation, timeout performed and anesthesia consent Lidocaine 1% used for infiltration and patient sedated radial was placed Catheter size: 20 G Hand hygiene performed  and maximum sterile barriers used  Allen's test indicative of satisfactory collateral circulation Attempts: 1 Procedure performed without using ultrasound guided technique. Following insertion, dressing applied. Post procedure assessment: normal  Patient tolerated the procedure well with no immediate complications.

## 2017-03-23 NOTE — Anesthesia Procedure Notes (Signed)
Central Venous Catheter Insertion Performed by: Marcie Bal, Suellen Durocher, anesthesiologist Start/End10/15/2018 7:01 AM, 03/23/2017 7:10 AM Patient location: Pre-op. Preanesthetic checklist: patient identified, IV checked, site marked, risks and benefits discussed, surgical consent, monitors and equipment checked, pre-op evaluation, timeout performed and anesthesia consent Position: Trendelenburg Lidocaine 1% used for infiltration and patient sedated Hand hygiene performed , maximum sterile barriers used  and Seldinger technique used Catheter size: 7 Fr Central line was placed.Double lumen Procedure performed using ultrasound guided technique. Ultrasound Notes:anatomy identified, needle tip was noted to be adjacent to the nerve/plexus identified and no ultrasound evidence of intravascular and/or intraneural injection Attempts: 1 Following insertion, line sutured, dressing applied and Biopatch. Post procedure assessment: blood return through all ports, free fluid flow and no air  Patient tolerated the procedure well with no immediate complications.

## 2017-03-23 NOTE — Plan of Care (Signed)
Problem: Activity: Goal: Risk for activity intolerance will decrease Outcome: Progressing Using lung pillow for splinting. Patient has dangled and then stood at bedside with minimal assistance. Tolerated well.   Problem: Respiratory: Goal: Respiratory status will improve Outcome: Progressing Patient using incentive spirometer-achieving 750. Also performing cough and deep breathing exercises- using pillow as spint.

## 2017-03-23 NOTE — Anesthesia Procedure Notes (Signed)
Procedure Name: Intubation Date/Time: 03/23/2017 8:01 AM Performed by: Bryson Corona Pre-anesthesia Checklist: Patient identified, Emergency Drugs available, Suction available, Patient being monitored and Timeout performed Patient Re-evaluated:Patient Re-evaluated prior to induction Oxygen Delivery Method: Circle system utilized Preoxygenation: Pre-oxygenation with 100% oxygen Induction Type: IV induction Ventilation: Mask ventilation without difficulty and Oral airway inserted - appropriate to patient size Laryngoscope Size: Mac and 3 Grade View: Grade I Tube type: Oral Endobronchial tube: Double lumen EBT and 37 Fr Placement Confirmation: ETT inserted through vocal cords under direct vision,  positive ETCO2 and breath sounds checked- equal and bilateral Tube secured with: Tape Comments: Intubated by Linna Caprice MD

## 2017-03-23 NOTE — Anesthesia Postprocedure Evaluation (Signed)
Anesthesia Post Note  Patient: Regina Black  Procedure(s) Performed: VIDEO ASSISTED THORACOSCOPY (VATS)/WEDGE RESECTION (Right Chest)     Patient location during evaluation: PACU Anesthesia Type: General Level of consciousness: awake, awake and alert and oriented Pain management: pain level controlled Vital Signs Assessment: post-procedure vital signs reviewed and stable Respiratory status: spontaneous breathing, respiratory function stable and nonlabored ventilation Cardiovascular status: blood pressure returned to baseline Anesthetic complications: no    Last Vitals:  Vitals:   03/23/17 1922 03/23/17 1936  BP:    Pulse:    Resp:    Temp: 36.7 C   SpO2:  96%    Last Pain:  Vitals:   03/23/17 1922  TempSrc: Oral  PainSc:                  Eulas Schweitzer COKER

## 2017-03-23 NOTE — Progress Notes (Signed)
Patient ID: Regina Black, female   DOB: [birth year], 61 y.o.   MRN: [id number] EVENING ROUNDS NOTE :     [address]       [place] [address]             [phone]                 Day of Surgery Procedure(s) (LRB): VIDEO ASSISTED THORACOSCOPY (VATS)/WEDGE RESECTION (Right)  Total Length of Stay:  LOS: 0 days  BP (!) 112/59   Pulse (!) 56   Temp 98.4 F (36.9 C) (Oral)   Resp 16   Ht 5\' 4"  (1.626 m)   SpO2 97%   .Intake/Output      10/14 0701 - 10/15 0700 10/15 0701 - 10/16 0700   P.O.  240   I.V.  1200   Total Intake   1440   Urine  330   Blood  50   Chest Tube  49   Total Output   429   Net   +1011          . bupivacaine 0.5 % ON-Q pump SINGLE CATH 400 mL    . cefUROXime (ZINACEF)  IV    . dextrose 5 % and 0.9% NaCl 100 mL/hr at 03/23/17 1500  . potassium chloride       Lab Results  Component Value Date   WBC 7.7 03/19/2017   HGB 12.9 03/19/2017   HCT 39.0 03/19/2017   PLT 274 03/19/2017   GLUCOSE 177 (H) 03/19/2017   CHOL 238 (A) 09/27/2016   TRIG 107 09/27/2016   HDL 63 09/27/2016   LDLCALC 154 09/27/2016   ALT 27 03/19/2017   AST 37 03/19/2017   NA 136 03/19/2017   K 3.5 03/19/2017   CL 101 03/19/2017   CREATININE 0.98 03/19/2017   BUN 13 03/19/2017   CO2 22 03/19/2017   TSH 0.58 11/05/2016   INR 0.92 03/19/2017   HGBA1C 5.5 12/20/2015   Stable postop , no air leak   Grace Isaac MD  Beeper [phone] Office [phone] 03/23/2017 5:38 PM

## 2017-03-23 NOTE — Transfer of Care (Signed)
Immediate Anesthesia Transfer of Care Note  Patient: Regina Black  Procedure(s) Performed: VIDEO ASSISTED THORACOSCOPY (VATS)/WEDGE RESECTION (Right Chest)  Patient Location: PACU  Anesthesia Type:General  Level of Consciousness: awake, alert  and oriented  Airway & Oxygen Therapy: Patient Spontanous Breathing and Patient connected to nasal cannula oxygen  Post-op Assessment: Report given to RN and Post -op Vital signs reviewed and stable  Post vital signs: Reviewed  Last Vitals:  Vitals:   03/23/17 0722 03/23/17 0723  BP:    Pulse: 66 68  Resp: 18 (!) 22  Temp:    SpO2: 100% 100%    Last Pain:  Vitals:   03/23/17 0555  TempSrc: Oral         Complications: No apparent anesthesia complications

## 2017-03-24 ENCOUNTER — Encounter (HOSPITAL_COMMUNITY): Payer: Self-pay | Admitting: Thoracic Surgery (Cardiothoracic Vascular Surgery)

## 2017-03-24 ENCOUNTER — Inpatient Hospital Stay (HOSPITAL_COMMUNITY): Payer: BLUE CROSS/BLUE SHIELD

## 2017-03-24 LAB — BLOOD GAS, ARTERIAL
ACID-BASE DEFICIT: 3.4 mmol/L — AB (ref 0.0–2.0)
Bicarbonate: 21.6 mmol/L (ref 20.0–28.0)
O2 Content: 2 L/min
O2 Saturation: 98 %
PCO2 ART: 42.3 mmHg (ref 32.0–48.0)
PH ART: 7.329 — AB (ref 7.350–7.450)
PO2 ART: 110 mmHg — AB (ref 83.0–108.0)
Patient temperature: 98.6

## 2017-03-24 LAB — CBC
HCT: 32.9 % — ABNORMAL LOW (ref 36.0–46.0)
Hemoglobin: 10.8 g/dL — ABNORMAL LOW (ref 12.0–15.0)
MCH: 29.8 pg (ref 26.0–34.0)
MCHC: 32.8 g/dL (ref 30.0–36.0)
MCV: 90.9 fL (ref 78.0–100.0)
PLATELETS: 248 10*3/uL (ref 150–400)
RBC: 3.62 MIL/uL — AB (ref 3.87–5.11)
RDW: 13.5 % (ref 11.5–15.5)
WBC: 11.5 10*3/uL — ABNORMAL HIGH (ref 4.0–10.5)

## 2017-03-24 LAB — BASIC METABOLIC PANEL
Anion gap: 6 (ref 5–15)
BUN: 12 mg/dL (ref 6–20)
CHLORIDE: 111 mmol/L (ref 101–111)
CO2: 21 mmol/L — AB (ref 22–32)
CREATININE: 0.77 mg/dL (ref 0.44–1.00)
Calcium: 8.4 mg/dL — ABNORMAL LOW (ref 8.9–10.3)
GFR calc non Af Amer: >60 mL/min (ref >60)
GLUCOSE: 136 mg/dL — AB (ref 65–99)
Potassium: 3.9 mmol/L (ref 3.5–5.1)
Sodium: 138 mmol/L (ref 135–145)

## 2017-03-24 MED ORDER — HYDROCHLOROTHIAZIDE 25 MG PO TABS
25.0000 mg | ORAL_TABLET | Freq: Every day | ORAL | Status: DC
  Administered 2017-03-24 – 2017-03-26 (×3): 25 mg via ORAL
  Filled 2017-03-24 (×3): qty 1

## 2017-03-24 MED ORDER — LISINOPRIL 40 MG PO TABS
40.0000 mg | ORAL_TABLET | Freq: Every day | ORAL | Status: DC
  Administered 2017-03-24 – 2017-03-26 (×3): 40 mg via ORAL
  Filled 2017-03-24: qty 2
  Filled 2017-03-24 (×2): qty 1

## 2017-03-24 NOTE — Progress Notes (Signed)
Metoprolol held for HR in 50's even after ambulation.  Per home medication rec, pt stated that Metorolol XL dose at home was 50mg  daily, NOT 75mg .  Made rounding MD aware at 1700, deferred decision to primary MD for morning rounds.    Darrel Reach, RN

## 2017-03-24 NOTE — Progress Notes (Signed)
1 Day Post-Op Procedure(s) (LRB): VIDEO ASSISTED THORACOSCOPY (VATS)/WEDGE RESECTION (Right) Subjective: Some incisional soreness  Objective: Vital signs in last 24 hours: Temp:  [97.8 F (36.6 C)-98.4 F (36.9 C)] 98.4 F (36.9 C) (10/16 0722) Pulse Rate:  [50-74] 52 (10/16 0700) Cardiac Rhythm: Sinus bradycardia (10/16 0400) Resp:  [14-25] 17 (10/16 0700) BP: (89-141)/(50-82) 126/74 (10/16 0700) SpO2:  [95 %-99 %] 98 % (10/16 0700) Arterial Line BP: (69-162)/(48-74) 136/62 (10/16 0700) Weight:  [195 lb 15.8 oz (88.9 kg)] 195 lb 15.8 oz (88.9 kg) (10/16 0500)  Hemodynamic parameters for last 24 hours:    Intake/Output from previous day: 10/15 0701 - 10/16 0700 In: 3820 [P.O.:1020; I.V.:2800] Out: 7026 [Urine:2810; Blood:50; Chest Tube:181] Intake/Output this shift: No intake/output data recorded.  General appearance: alert, cooperative and no distress Neurologic: intact Heart: regular rate and rhythm Lungs: diminished breath sounds right base Abdomen: normal findings: soft, non-tender no air leak  Lab Results:  Recent Labs  03/24/17 0417  WBC 11.5*  HGB 10.8*  HCT 32.9*  PLT 248   BMET:  Recent Labs  03/24/17 0417  NA 138  K 3.9  CL 111  CO2 21*  GLUCOSE 136*  BUN 12  CREATININE 0.77  CALCIUM 8.4*    PT/INR: No results for input(s): LABPROT, INR in the last 72 hours. ABG    Component Value Date/Time   PHART 7.329 (L) 03/24/2017 0425   HCO3 21.6 03/24/2017 0425   ACIDBASEDEF 3.4 (H) 03/24/2017 0425   O2SAT 98.0 03/24/2017 0425   CBG (last 3)   Recent Labs  03/23/17 0600  GLUCAP 133*    Assessment/Plan: S/P Procedure(s) (LRB): VIDEO ASSISTED THORACOSCOPY (VATS)/WEDGE RESECTION (Right) Plan for transfer to step-down: see transfer orders  CV- hypertension- restart lisinopril and HCTZ  RESP- no air leak- CT to water seal  IS  RENAL- creatinine and lytes OK, keep Foley one more day  ENDO- CBG mildly elevated  DVT prophylaxis- SCD +  enoxaparin  Advance diet  Ambulate   LOS: 1 day    [name] 03/24/2017

## 2017-03-24 NOTE — Care Management Note (Signed)
Case Management Note  Patient Details  Name: Regina Black MRN: [id number] Date of Birth: 08/30/1955  Subjective/Objective:    Adenocarcinoma, right upper lobe, R VATS                Action/Plan: Discharge Planning: NCM spoke to pt and husband at bedsdie. Pt was independent pta. She was working full-time. Pt will need a RW and 3n1 bedside commode at dc. Waiting PT/OT evaluation. Will continue to follow for dc needs.   PCP KUNEFF, RENEE A MD  Expected Discharge Date:             Expected Discharge Plan:  Home/Self Care  In-House Referral:  NA  Discharge planning Services  CM Consult   Status of Service:  In process, will continue to follow  If discussed at Long Length of Stay Meetings, dates discussed:    Additional Comments:  Regina Rasher, RN 03/24/2017, 4:10 PM

## 2017-03-24 NOTE — Progress Notes (Signed)
[facility] BRIEF SICU PROGRESS NOTE  1 Day Post-Op  S/P Procedure(s) (LRB): VIDEO ASSISTED THORACOSCOPY (VATS)/WEDGE RESECTION (Right)   Stable day Awaiting bed on step down unit for transfer  Plan: Continue current plan  Rexene Alberts, MD 03/24/2017 5:43 PM

## 2017-03-24 NOTE — Op Note (Signed)
NAMEMIKERIA, Regina Black             ACCOUNT NO.:  [id number]  MEDICAL RECORD NO.:  [id number]  LOCATION:                                 FACILITY:  PHYSICIAN:  Revonda Standard. Roxan Hockey, M.D. DATE OF BIRTH:  DATE OF PROCEDURE:  03/23/2017 DATE OF DISCHARGE:                              OPERATIVE REPORT   PREOPERATIVE DIAGNOSIS:  Right upper lobe nodule.  POSTOPERATIVE DIAGNOSIS:  Adenocarcinoma, right upper lobe.  PROCEDURES:   Right video-assisted thoracoscopy, Wedge resection, Lymph node sampling, and  On-Q local anesthetic catheter placement.  SURGEON:  Revonda Standard. Roxan Hockey, MD.  ASSISTANTEllwood Handler, PA-C.  ANESTHESIA:  General.  FINDINGS:  Only 1 nodule in the right upper lobe palpable, resected with 1-cm gross margin.  Frozen section revealed non-small cell carcinoma.  CLINICAL NOTE:  Regina Black is a 61 year old woman with a remote history of tobacco abuse who has multiple lung nodules.  She previously had a navigational bronchoscopy which showed inflammation, but no tumor was seen.  With continued followup, the nodules remained relatively unchanged, but the patient became progressively more anxious.  She was referred for surgical biopsy.  Due to the bilateral and multifocal nature of the process, wedge resection was recommended with no plan for lobectomy.  The indications, risks, benefits, and alternatives were discussed in detail with the patient.  She understood and accepted the risks and agreed to proceed.  OPERATIVE NOTE:  Regina Black was brought to the operating room on March 23, 2017.  She had induction of general anesthesia and was intubated with a double-lumen endotracheal tube.  Intravenous antibiotics were administered.  A Foley catheter was placed.  Sequential compression devices were placed on the calves for DVT prophylaxis.  She was placed in a left lateral decubitus position, and the right chest was prepped and draped in usual sterile fashion.   Single lung ventilation of the left lung was initiated and it was tolerated throughout the procedure.  The right chest was prepped and draped in usual sterile fashion.  After performing a time-out to confirm the patient and site and procedure, an incision was made in the seventh interspace in the midaxillary line.  A 5-mm port was inserted into the chest.  The thoracoscope was advanced into the chest.  There was good isolation of the right lung.  There was no abnormality of the parietal pleura and no pleural effusion.  A 5-cm working incision was made in the fourth interspace anterolaterally.  No rib spreading was performed during the procedure.  The upper lobe was grasped.  In the lateral aspect of the upper lobe, there was invagination of the visceral pleura.  There was a nodule palpable in that area.  There was no other palpable nodule posterior to that.  A wedge resection was performed of the primary nodule keeping 1 cm gross margin and proceeding from anterior to posterior to hopefully incorporate the second nodule seen on CT scan, which was not palpable.  The wedge resection was done with sequential firings of an Echelon-powered endoscopic stapler using 60-mm gold cartridges.  This specimen was placed into an endoscopic retrieval bag, removed and sent for frozen section of the nodule and the margin.  There was good hemostasis at the staple lines.  While awaiting the results of the frozen section, the lung was retracted anteriorly.  The pleura was incised at the posterior hilum.  A level 7 lymph node was identified. This appeared normal.  It was removed and sent for permanent pathology. The pleural reflection was divided at the hilum anteriorly and superiorly, and two level 10 nodes and level 4R node were removed and sent as separate specimens as well.  Again, these all appeared normal grossly.  The chest was copiously irrigated with warm saline.  A test inflation revealed no  significant air leakage from the staple line.  The frozen section returned showing adenocarcinoma and the margin was negative for tumor.  An On-Q local anesthetic catheter was placed through a separate stab incision posteriorly and tunneled into a subpleural location.  It was primed with 5 mL of 0.5% Marcaine.  A 28- French chest tube was placed through the original port incision and directed to the apex.  The right lung was reinflated.  The working incision was closed in standard fashion.  The chest tube was placed to suction.  The patient was placed back in a supine position.  She was extubated in the operating room and taken to the postanesthetic care unit in good condition.     Revonda Standard Roxan Hockey, M.D.     SCH/MEDQ  D:  03/23/2017  T:  03/23/2017  Job:  [id number]

## 2017-03-25 ENCOUNTER — Inpatient Hospital Stay (HOSPITAL_COMMUNITY): Payer: BLUE CROSS/BLUE SHIELD

## 2017-03-25 LAB — CBC
HCT: 33.8 % — ABNORMAL LOW (ref 36.0–46.0)
Hemoglobin: 10.8 g/dL — ABNORMAL LOW (ref 12.0–15.0)
MCH: 29.5 pg (ref 26.0–34.0)
MCHC: 32 g/dL (ref 30.0–36.0)
MCV: 92.3 fL (ref 78.0–100.0)
Platelets: 223 K/uL (ref 150–400)
RBC: 3.66 MIL/uL — ABNORMAL LOW (ref 3.87–5.11)
RDW: 13.9 % (ref 11.5–15.5)
WBC: 9.2 K/uL (ref 4.0–10.5)

## 2017-03-25 LAB — COMPREHENSIVE METABOLIC PANEL
ALT: 19 U/L (ref 14–54)
AST: 25 U/L (ref 15–41)
Albumin: 3.1 g/dL — ABNORMAL LOW (ref 3.5–5.0)
Alkaline Phosphatase: 74 U/L (ref 38–126)
Anion gap: 6 (ref 5–15)
BUN: 13 mg/dL (ref 6–20)
CHLORIDE: 105 mmol/L (ref 101–111)
CO2: 27 mmol/L (ref 22–32)
CREATININE: 0.84 mg/dL (ref 0.44–1.00)
Calcium: 8.6 mg/dL — ABNORMAL LOW (ref 8.9–10.3)
Glucose, Bld: 91 mg/dL (ref 65–99)
POTASSIUM: 3.5 mmol/L (ref 3.5–5.1)
SODIUM: 138 mmol/L (ref 135–145)
Total Bilirubin: 0.5 mg/dL (ref 0.3–1.2)
Total Protein: 6 g/dL — ABNORMAL LOW (ref 6.5–8.1)

## 2017-03-25 LAB — POCT I-STAT 7, (LYTES, BLD GAS, ICA,H+H)
Acid-base deficit: 5 mmol/L — ABNORMAL HIGH (ref 0.0–2.0)
Bicarbonate: 21 mmol/L (ref 20.0–28.0)
Calcium, Ion: 1.1 mmol/L — ABNORMAL LOW (ref 1.15–1.40)
HEMATOCRIT: 32 % — AB (ref 36.0–46.0)
Hemoglobin: 10.9 g/dL — ABNORMAL LOW (ref 12.0–15.0)
O2 Saturation: 100 %
PCO2 ART: 44 mmHg (ref 32.0–48.0)
POTASSIUM: 3 mmol/L — AB (ref 3.5–5.1)
Sodium: 142 mmol/L (ref 135–145)
TCO2: 22 mmol/L (ref 22–32)
pH, Arterial: 7.286 — ABNORMAL LOW (ref 7.350–7.450)
pO2, Arterial: 381 mmHg — ABNORMAL HIGH (ref 83.0–108.0)

## 2017-03-25 MED ORDER — METOPROLOL SUCCINATE ER 50 MG PO TB24
50.0000 mg | ORAL_TABLET | Freq: Every day | ORAL | Status: DC
  Administered 2017-03-25 – 2017-03-26 (×2): 50 mg via ORAL
  Filled 2017-03-25 (×2): qty 1

## 2017-03-25 NOTE — Discharge Instructions (Signed)
1. Wash incision with soap and water daily... You may shower, no tub baths or swimming 2. No Driving until you follow up with Dr. Roxan Black, and you are off all narcotic pain medications 3. Resume home diet 4. Activity- up as tolerated, avoid strenuous activity and heavy lifting for a few weeks 5. Call the office if issues develop, or if incisions are red and draining   Video-Assisted Thoracic Surgery, Care After This sheet gives you information about how to care for yourself after your procedure. Your health care provider may also give you more specific instructions. If you have problems or questions, contact your health care provider. What can I expect after the procedure? After the procedure, it is common to have:  Some pain and soreness in your chest.  Pain when breathing in (inhaling) and coughing.  Constipation.  Fatigue.  Difficulty sleeping.  Follow these instructions at home: Preventing pneumonia  Take deep breaths or do breathing exercises as instructed by your health care provider. Doing this helps prevent lung infection (pneumonia).  Cough frequently. Coughing may cause discomfort, but it is important to clear mucus (phlegm) and expand your lungs. If it hurts to cough, hold a pillow against your chest or place the palms of both hands on top of the incision (use splinting) when you cough. This may help relieve discomfort.  If you were given an incentive spirometer, use it as directed. An incentive spirometer is a tool that measures how well you are filling your lungs with each breath.  Participate in pulmonary rehabilitation as directed by your health care provider. This is a program that combines education, exercise, and support from a team of specialists. The goal is to help you heal and get back to your normal activities as soon as possible. Medicines  Take over-the-counter or prescription medicines only as told by your health care provider.  If you have pain, take  pain-relieving medicine before your pain becomes severe. This is important because if your pain is under control, you will be able to breathe and cough more comfortably.  If you were prescribed an antibiotic medicine, take it as told by your health care provider. Do not stop taking the antibiotic even if you start to feel better. Activity  Ask your health care provider what activities are safe for you.  Avoid activities that use your chest muscles for at least 3-4 weeks.  Do not lift anything that is heavier than 10 lb (4.5 kg), or the limit that your health care provider tells you, until he or she says that it is safe. Incision care  Follow instructions from your health care provider about how to take care of your incision(s). Make sure you: ? Wash your hands with soap and water before you change your bandage (dressing). If soap and water are not available, use hand sanitizer. ? Change your dressing as told by your health care provider. ? Leave stitches (sutures), skin glue, or adhesive strips in place. These skin closures may need to stay in place for 2 weeks or longer. If adhesive strip edges start to loosen and curl up, you may trim the loose edges. Do not remove adhesive strips completely unless your health care provider tells you to do that.  Keep your dressing dry until it has been removed.  Check your incision area every day for signs of infection. Check for: ? Redness, swelling, or pain. ? Fluid or blood. ? Warmth. ? Pus or a bad smell. Bathing  Do not take  baths, swim, or use a hot tub until your health care provider approves. You may take showers.  After your dressing has been removed, use soap and water to gently wash your incision area. Do not use anything else to clean your incision(s) unless your health care provider tells you to do this. Driving  Do not drive until your health care provider approves.  Do not drive or use heavy machinery while taking prescription pain  medicine. Eating and drinking  Eat a healthy, balanced diet as instructed by your health care provider. A healthy diet includes plenty of fresh fruits and vegetables, whole grains, and low-fat (lean) proteins.  Limit foods that are high in fat and processed sugars, such as fried and sweet foods.  Drink enough fluid to keep your urine clear or pale yellow. General instructions  To prevent or treat constipation while you are taking prescription pain medicine, your health care provider may recommend that you: ? Take over-the-counter or prescription medicines. ? Eat foods that are high in fiber, such as beans, fresh fruits and vegetables, and whole grains.  Do not use any products that contain nicotine or tobacco, such as cigarettes and e-cigarettes. If you need help quitting, ask your health care provider.  Avoid secondhand smoke.  Wear compression stockings as told by your health care provider. These stockings help to prevent blood clots and reduce swelling in your legs.  If you have a chest tube, care for it as instructed by your health care provider. Do not travel by airplane during the 2 weeks after your chest tube is removed, or until your health care provider says that this is safe.  Keep all follow-up visits as told by your health care provider. This is important. Contact a health care provider if:  You have redness, swelling, or pain around an incision.  You have fluid or blood coming from an incision.  Your incision area feels warm to the touch.  You have pus or a bad smell coming from an incision.  You have a fever or chills.  You have nausea or vomiting.  You have pain that does not get better with medicine. Get help right away if:  You have chest pain.  Your heart is fluttering or beating rapidly.  You develop a rash.  You have shortness of breath or trouble breathing.  You are confused.  You have trouble speaking.  You feel weak, light-headed, or  dizzy.  You faint. Summary  To help prevent lung infection (pneumonia), take deep breaths or do breathing exercises as instructed by your health care provider.  Cough frequently to clear mucus (phlegm) and expand your lungs. If it hurts to cough, hold a pillow against your chest or place the palms of both hands on top of the incision (use splinting) when you cough.  If you have pain, take pain-relieving medicine before your pain becomes severe. This is important because if your pain is under control, you will be able to breathe and cough more comfortably.  Ask your health care provider what activities are safe for you. This information is not intended to replace advice given to you by your health care provider. Make sure you discuss any questions you have with your health care provider. Document Released: 09/20/2012 Document Revised: 05/05/2016 Document Reviewed: 05/05/2016 Elsevier Interactive Patient Education  2017 [employer].

## 2017-03-25 NOTE — Progress Notes (Signed)
Patient's chest tube removed as per MD order without difficulty.  Will continue to monitor.

## 2017-03-25 NOTE — Discharge Summary (Signed)
Physician Discharge Summary  Patient ID: Regina Black MRN: [id number] DOB/AGE: [birth year] 61 y.o.  Admit date: 03/23/2017 Discharge date: 03/26/2017  Admission Diagnoses:  Patient Active Problem List   Diagnosis Date Noted  . Seasonal allergic rhinitis due to pollen 02/18/2017  . Hyperlipidemia 11/07/2016  . Allergic rhinitis 12/26/2015  . Elevated alkaline phosphatase level 12/26/2015  . Elevated serum gamma-glutamyl transferase level 12/26/2015  . Vitamin D deficiency 12/24/2015  . GERD (gastroesophageal reflux disease) 12/21/2015  . Elevated LFTs 12/21/2015  . Arrhythmia 12/21/2015  . Essential hypertension, benign 11/22/2015  . Depression with anxiety 11/22/2015  . Morbid obesity ([place]) 11/22/2015  . Multiple pulmonary nodules 11/22/2015  . Thyroid nodule 11/22/2015  . CAD (coronary artery disease) 11/22/2015  . Fatty liver disease, nonalcoholic 68/34/1962  . Degenerative arthritis of thoracic spine 11/22/2015   Discharge Diagnoses:   Patient Active Problem List   Diagnosis Date Noted  . Adenocarcinoma of lung ([place]) 03/26/2017  . S/P partial lobectomy of lung 03/23/2017  . Seasonal allergic rhinitis due to pollen 02/18/2017  . Hyperlipidemia 11/07/2016  . Allergic rhinitis 12/26/2015  . Elevated alkaline phosphatase level 12/26/2015  . Elevated serum gamma-glutamyl transferase level 12/26/2015  . Vitamin D deficiency 12/24/2015  . GERD (gastroesophageal reflux disease) 12/21/2015  . Elevated LFTs 12/21/2015  . Arrhythmia 12/21/2015  . Essential hypertension, benign 11/22/2015  . Depression with anxiety 11/22/2015  . Morbid obesity ([place]) 11/22/2015  . Multiple pulmonary nodules 11/22/2015  . Thyroid nodule 11/22/2015  . CAD (coronary artery disease) 11/22/2015  . Fatty liver disease, nonalcoholic 22/97/9892  . Degenerative arthritis of thoracic spine 11/22/2015   Discharged Condition: good  History of Present Illness:  Regina Black is a 61 yo white  female with PMH of Hypertension, Hyperlipidemia, heart murmur, asthma, GERD, Thyroid nodules, and multiple lung nodules.  Her lung nodules were initially diagnosed in 2016 at which time she presented with complaints of chest pain, shortness of breath, and workup.  CTA of the chest was done and ruled out pulmonary embolism.  This noted to have multiple groundglass opacities.  Repeat CT scan in August of 2017 again noted multiple groundglass opacities bilaterally.  She had a Navigational bronchoscopy in 2017 which was negative for malignancy.  Most recent follow up CT scan showed no significant change in the nodules, but there was concern these could be low grade Adenocarcinomas.  After further discussion with Dr. Lake Bells the patient requested a thoracic surgical opinion for possible resection.  She was evaluated by Dr. Roxan Hockey who explained negative biopsy would not rule out the possibility of low grade adenocarcinoma.  He discussed the various treatment options and the patient ultimately decided to undergo VATS procedure with wedge resection for definitive diagnosis.  The risks and benefits of the procedure were explained to the patient and she was agreeable to proceed.  Hospital Course:   Regina Black presented to [facility] on 03/23/2017.  She was taken to the operating room and underwent Right VATS with wedge resection RUL, lymph node dissection, and placement of ON- Q Anesthetic Catheter.  She tolerated the procedure without difficulty, was extubated and taken to the SICU in stable condition.  The patient made good progress post operatively.  Her chest tube was free from air leak and placed on water seal on POD #1.  CXR showed no evidence of pneumothorax.  She was ambulating independently and felt medically stable for transfer to the telemetry unit.  The patient continued to make progress.  Morning CXR  remained stable.  Her chest tube was free of air leak and was removed on POD #2.  Follow up CXR  was obtained and showed persistent elevation of right hemidiaphragm and stable apical pneumothorax.  She continues to ambulate without difficulty.  She is hemodynamically stable.  She will be discharged home today.    Significant Diagnostic Studies: Pathology  1. Lung, wedge biopsy/resection, RUL - ADENOCARCINOMA, MODERATELY DIFFERENTIATED, 1.6 CM. - SEE ONCOLOGY TABLE AND COMMENT. 2. Lymph node, biopsy, 10 R - ANTHRACOTIC LYMPH NODE. - NO MALIGNANCY IDENTIFIED. 3. Lymph node, biopsy, 10 R #2 - ANTHRACOTIC LYMPH NODE. - NO MALIGNANCY IDENTIFIED. 4. Lymph node, biopsy, L 7 - ANTHRACOTIC LYMPH NODE. - NO MALIGNANCY IDENTIFIED. 5. Lymph node, biopsy, 4 R - ANTHRACOTIC LYMPH NODE. - NO MALIGNANCY IDENTIFIED.  Treatments: surgery:   Right video-assisted thoracoscopy, wedge resection, lymph node sampling, and On-Q local anesthetic catheter placement.  Disposition: 01-Home or Self Care   Discharge Medications:   Allergies as of 03/26/2017      Reactions   Other Other (See Comments)   Pt was young adult, had tonsils removed - pt hemorrhaged after the procedure    Butorphanol Other (See Comments)   Syncope UNABLE TO MOVE LIMBS   Codeine Nausea And Vomiting      Medication List    TAKE these medications   acetaminophen 500 MG tablet Commonly known as:  TYLENOL Take 2 tablets (1,000 mg total) by mouth every 6 (six) hours as needed.   DULoxetine 60 MG capsule Commonly known as:  CYMBALTA Take 1 capsule (60 mg total) by mouth daily. Needs office visit prior to anymore refills   fluticasone 50 MCG/ACT nasal spray Commonly known as:  FLONASE Place 2 sprays into both nostrils daily as needed (allergies/congestion).   hydrochlorothiazide 25 MG tablet Commonly known as:  HYDRODIURIL Take 25 mg by mouth daily.   lisinopril 40 MG tablet Commonly known as:  PRINIVIL,ZESTRIL Take 40 mg by mouth daily.   loratadine 10 MG tablet Commonly known as:  CLARITIN Take 10 mg by mouth  daily.   metoprolol succinate 25 MG 24 hr tablet Commonly known as:  TOPROL-XL Take 3 tablets (75 mg total) by mouth daily. Take with or immediately following a meal. What changed:  how much to take  additional instructions   Olopatadine HCl 0.2 % Soln Apply 1 drop to eye daily. 1 drop each eye daily What changed:  how to take this  when to take this  reasons to take this  additional instructions   omeprazole 20 MG capsule Commonly known as:  PRILOSEC Take 20 mg by mouth daily.   oxyCODONE 5 MG immediate release tablet Commonly known as:  Oxy IR/ROXICODONE Take 1-2 tablets (5-10 mg total) by mouth every 4 (four) hours as needed for severe pain.   penciclovir 1 % cream Commonly known as:  DENAVIR Apply 1 application topically every 4 (four) hours as needed (applied to lip area for recurrent cold sores).   valACYclovir 1000 MG tablet Commonly known as:  VALTREX Take 1 g by mouth 2 (two) times daily as needed. For fever blisters/cold sores.      Follow-up Information    [name], MD Follow up on 04/14/2017.   Specialty:  Cardiothoracic Surgery Why:  Appointment is at 10:45, please get CXR at 10:15 at [facility] located on first floor of our office building Contact information: [address] [place] [address] [phone]  SignedEllwood Handler 03/26/2017, 7:44 AM

## 2017-03-25 NOTE — Progress Notes (Addendum)
      [address]       [place] [address]             [phone]      2 Days Post-Op Procedure(s) (LRB): VIDEO ASSISTED THORACOSCOPY (VATS)/WEDGE RESECTION (Right)   Subjective:  Ms. Appenzeller has no new complaints.  Continues to have some incisional soreness.  No BM, + flatus  Objective: Vital signs in last 24 hours: Temp:  [98 F (36.7 C)-98.5 F (36.9 C)] 98.2 F (36.8 C) (10/17 0529) Pulse Rate:  [51-73] 69 (10/17 0134) Cardiac Rhythm: Normal sinus rhythm (10/17 0700) Resp:  [12-28] 12 (10/17 0134) BP: (112-154)/(61-103) 140/68 (10/17 0134) SpO2:  [93 %-99 %] 93 % (10/17 0134) Arterial Line BP: (120-143)/(55-74) 143/74 (10/16 0900) Weight:  [196 lb 8 oz (89.1 kg)] 196 lb 8 oz (89.1 kg) (10/17 0123)  Intake/Output from previous day: 10/16 0701 - 10/17 0700 In: 320 [I.V.:320] Out: 3815 [Urine:3525; Chest Tube:290]  General appearance: alert, cooperative and no distress Heart: regular rate and rhythm Lungs: clear to auscultation bilaterally Abdomen: soft, non-tender; bowel sounds normal; no masses,  no organomegaly Extremities: extremities normal, atraumatic, no cyanosis or edema Wound: clean and dry  Lab Results:  Recent Labs  03/24/17 0417 03/25/17 0523  WBC 11.5* 9.2  HGB 10.8* 10.8*  HCT 32.9* 33.8*  PLT 248 223   BMET:  Recent Labs  03/24/17 0417 03/25/17 0523  NA 138 138  K 3.9 3.5  CL 111 105  CO2 21* 27  GLUCOSE 136* 91  BUN 12 13  CREATININE 0.77 0.84  CALCIUM 8.4* 8.6*    PT/INR: No results for input(s): LABPROT, INR in the last 72 hours. ABG    Component Value Date/Time   PHART 7.329 (L) 03/24/2017 0425   HCO3 21.6 03/24/2017 0425   ACIDBASEDEF 3.4 (H) 03/24/2017 0425   O2SAT 98.0 03/24/2017 0425   CBG (last 3)   Recent Labs  03/23/17 0600  GLUCAP 133*    Assessment/Plan: S/P Procedure(s) (LRB): VIDEO ASSISTED THORACOSCOPY (VATS)/WEDGE RESECTION (Right)  1. Chest tube- no air leak present on water  seal, CXR remains stable- will d/c chest tube today 2. Pulm- no acute issues, continue IS 3. CV- some bradycardia yesterday, nursing notes reviewed- decreased Toprol XL to home regimen of 50 mg daily, BP improved with Lisinopril/HCTZ will continue 4. D/C foley catheter 5. Dispo- patient stable, will d/c chest tube today, home tomorrow if CXR is stable   LOS: 2 days    BARRETT, ERIN 03/25/2017 Patient seen and examined, agree with above Some elevation of right hemidiaphragm on CXR Dc chest tube  Remo Lipps C. Roxan Hockey, MD [facility] [phone]

## 2017-03-26 ENCOUNTER — Encounter: Payer: Self-pay | Admitting: *Deleted

## 2017-03-26 ENCOUNTER — Inpatient Hospital Stay (HOSPITAL_COMMUNITY): Payer: BLUE CROSS/BLUE SHIELD

## 2017-03-26 ENCOUNTER — Encounter (HOSPITAL_COMMUNITY): Payer: Self-pay | Admitting: Physician Assistant

## 2017-03-26 DIAGNOSIS — C3490 Malignant neoplasm of unspecified part of unspecified bronchus or lung: Secondary | ICD-10-CM

## 2017-03-26 HISTORY — DX: Malignant neoplasm of unspecified part of unspecified bronchus or lung: C34.90

## 2017-03-26 LAB — TYPE AND SCREEN
ABO/RH(D): A POS
ANTIBODY SCREEN: NEGATIVE
UNIT DIVISION: 0
Unit division: 0

## 2017-03-26 LAB — BPAM RBC
Blood Product Expiration Date: 201810302359
Blood Product Expiration Date: 201810312359
UNIT TYPE AND RH: 6200
UNIT TYPE AND RH: 6200

## 2017-03-26 MED ORDER — OXYCODONE HCL 5 MG PO TABS: 5.0000 mg | ORAL_TABLET | ORAL | 0 refills | Status: DC | PRN

## 2017-03-26 MED ORDER — ACETAMINOPHEN 500 MG PO TABS: 1000.0000 mg | ORAL_TABLET | Freq: Four times a day (QID) | ORAL | 0 refills | Status: DC | PRN

## 2017-03-26 NOTE — Progress Notes (Signed)
Patient's right IJ catheter was removed per MD orders without difficulty.  Will continue to monitor.

## 2017-03-26 NOTE — Progress Notes (Signed)
Order received to discharge patient.  PIV access removed.  Telemetry monitor removed and CCMD notified.  Discharge instructions, follow up, medications and instructions for their use discussed with patient.

## 2017-03-26 NOTE — Progress Notes (Signed)
Oncology Nurse Navigator Documentation  Oncology Nurse Navigator Flowsheets 03/26/2017  Navigator Location [facility]  Navigator Encounter Type Other/per cancer conference foundation one and PDL 1 ordered   Barriers/Navigation Needs Coordination of Care  Interventions Coordination of Care  Acuity Level 2  Acuity Level 2 Other  Time Spent with Patient 30

## 2017-03-26 NOTE — Progress Notes (Signed)
      [address]       ,[place] [address]             [phone]      3 Days Post-Op Procedure(s) (LRB): VIDEO ASSISTED THORACOSCOPY (VATS)/WEDGE RESECTION (Right)   Subjective:  No new complaints.  Feels good ready to go home.  + BM  Objective: Vital signs in last 24 hours: Temp:  [98.3 F (36.8 C)-98.4 F (36.9 C)] 98.4 F (36.9 C) (10/18 0347) Pulse Rate:  [74-75] 75 (10/18 0347) Cardiac Rhythm: Normal sinus rhythm (10/18 0700) Resp:  [28-33] 28 (10/18 0347) BP: (142-157)/(81-86) 142/86 (10/18 0347) SpO2:  [89 %-94 %] 89 % (10/18 0347)  Intake/Output from previous day: 10/17 0701 - 10/18 0700 In: 110 [I.V.:110] Out: 1 [Stool:1]  General appearance: alert, cooperative and no distress Heart: regular rate and rhythm Lungs: diminished right base Abdomen: soft, non-tender; bowel sounds normal; no masses,  no organomegaly Extremities: extremities normal, atraumatic, no cyanosis or edema Wound: clean and dry  Lab Results:  Recent Labs  03/24/17 0417 03/25/17 0523  WBC 11.5* 9.2  HGB 10.8* 10.8*  HCT 32.9* 33.8*  PLT 248 223   BMET:  Recent Labs  03/24/17 0417 03/25/17 0523  NA 138 138  K 3.9 3.5  CL 111 105  CO2 21* 27  GLUCOSE 136* 91  BUN 12 13  CREATININE 0.77 0.84  CALCIUM 8.4* 8.6*    PT/INR: No results for input(s): LABPROT, INR in the last 72 hours. ABG    Component Value Date/Time   PHART 7.329 (L) 03/24/2017 0425   HCO3 21.6 03/24/2017 0425   TCO2 22 03/23/2017 0830   ACIDBASEDEF 3.4 (H) 03/24/2017 0425   O2SAT 98.0 03/24/2017 0425   CBG (last 3)  No results for input(s): GLUCAP in the last 72 hours.  Assessment/Plan: S/P Procedure(s) (LRB): VIDEO ASSISTED THORACOSCOPY (VATS)/WEDGE RESECTION (Right)  1. Pulm- no acute issues, CXR remains stable, continued elevation of right hemidiaphragm, right atelectasis 2. CV- hemodynamically stable, on Toprol XL, Lisinopril/HCTZ 3. Constipation- resolved 4. Dispo- patient  stable, will d/c home today   LOS: 3 days    Francenia Chimenti 03/26/2017

## 2017-03-30 ENCOUNTER — Ambulatory Visit: Payer: BLUE CROSS/BLUE SHIELD | Admitting: Thoracic Surgery (Cardiothoracic Vascular Surgery)

## 2017-03-31 ENCOUNTER — Telehealth: Payer: Self-pay

## 2017-03-31 NOTE — Telephone Encounter (Signed)
Patient called requesting pathology results.  Anxious to wait until follow up appointment.  Please advise.

## 2017-03-31 NOTE — Telephone Encounter (Signed)
Informed path showed a low grade adenocarcinoma. All lymph nodes were negative  Remo Lipps C. Roxan Hockey, MD [facility] [phone]

## 2017-04-01 ENCOUNTER — Ambulatory Visit (INDEPENDENT_AMBULATORY_CARE_PROVIDER_SITE_OTHER): Payer: Self-pay | Admitting: *Deleted

## 2017-04-01 DIAGNOSIS — C3411 Malignant neoplasm of upper lobe, right bronchus or lung: Secondary | ICD-10-CM

## 2017-04-01 DIAGNOSIS — Z4802 Encounter for removal of sutures: Secondary | ICD-10-CM

## 2017-04-01 DIAGNOSIS — Z09 Encounter for follow-up examination after completed treatment for conditions other than malignant neoplasm: Secondary | ICD-10-CM

## 2017-04-01 NOTE — Progress Notes (Signed)
Regina Black comes to the office with complaints that her chest tube suture is irritati ng. She had a R VATS/RUL WEDGE RESECTION on 03/23/17.  On exam the area is reddened without drainage. The suture was easily removed. Neosporin was applied and a band aide. She was instructed to do this for only a few days. Her mini thoracotomy incision is healing well with glue remaining. Otherwise, recovery is uneventful. Dr. Koleen Nimrod has called and discussed the pathology with her. She will return as scheduled with a CXR.

## 2017-04-06 ENCOUNTER — Ambulatory Visit: Payer: BLUE CROSS/BLUE SHIELD

## 2017-04-06 ENCOUNTER — Ambulatory Visit (INDEPENDENT_AMBULATORY_CARE_PROVIDER_SITE_OTHER): Payer: Self-pay | Admitting: Physician Assistant

## 2017-04-06 VITALS — BP 166/93 | HR 80 | Temp 97.1°F | Resp 16 | Ht 64.0 in | Wt 190.0 lb

## 2017-04-06 DIAGNOSIS — C3411 Malignant neoplasm of upper lobe, right bronchus or lung: Secondary | ICD-10-CM

## 2017-04-06 DIAGNOSIS — Z09 Encounter for follow-up examination after completed treatment for conditions other than malignant neoplasm: Secondary | ICD-10-CM

## 2017-04-06 MED ORDER — CEPHALEXIN 500 MG PO CAPS: 500.0000 mg | ORAL_CAPSULE | Freq: Three times a day (TID) | ORAL | 0 refills | Status: DC

## 2017-04-06 MED ORDER — NYSTATIN 100000 UNIT/GM EX CREA: 1.0000 "application " | TOPICAL_CREAM | Freq: Two times a day (BID) | CUTANEOUS | 1 refills | Status: DC

## 2017-04-06 NOTE — Progress Notes (Signed)
HPI:  Patient returns for wound issues.  She is S/P Right VATS, RUL resection on 03/23/2017.  She states she noticed her chest tube site was red.  She states however it is not red on her incision site.  She states she has problems with stitches as after her breast reduction surgery she required wound packing and it was quite difficult to get the wound to heal.  She denies fevers.  She states she has also noticed some burning and itching under her right breast as well.  Current Outpatient Prescriptions  Medication Sig Dispense Refill  . acetaminophen (TYLENOL) 500 MG tablet Take 2 tablets (1,000 mg total) by mouth every 6 (six) hours as needed. 30 tablet 0  . DULoxetine (CYMBALTA) 60 MG capsule Take 1 capsule (60 mg total) by mouth daily. Needs office visit prior to anymore refills 90 capsule 1  . fluticasone (FLONASE) 50 MCG/ACT nasal spray Place 2 sprays into both nostrils daily as needed (allergies/congestion).    . hydrochlorothiazide (HYDRODIURIL) 25 MG tablet Take 25 mg by mouth daily.    Marland Kitchen lisinopril (PRINIVIL,ZESTRIL) 40 MG tablet Take 40 mg by mouth daily.    Marland Kitchen loratadine (CLARITIN) 10 MG tablet Take 10 mg by mouth daily.    . metoprolol succinate (TOPROL-XL) 25 MG 24 hr tablet Take 3 tablets (75 mg total) by mouth daily. Take with or immediately following a meal. (Patient taking differently: Take 50 mg by mouth daily. Take with or immediately following a meal.) 90 tablet 0  . Olopatadine HCl 0.2 % SOLN Apply 1 drop to eye daily. 1 drop each eye daily (Patient taking differently: Place 1 drop into both eyes daily as needed (eye allergies). 1 drop each eye daily) 1 Bottle 0  . omeprazole (PRILOSEC) 20 MG capsule Take 20 mg by mouth daily.     Marland Kitchen oxyCODONE (OXY IR/ROXICODONE) 5 MG immediate release tablet Take 1-2 tablets (5-10 mg total) by mouth every 4 (four) hours as needed for severe pain. 40 tablet 0  . penciclovir (DENAVIR) 1 % cream Apply 1 application topically every 4 (four) hours as  needed (applied to lip area for recurrent cold sores).     . valACYclovir (VALTREX) 1000 MG tablet Take 1 g by mouth 2 (two) times daily as needed. For fever blisters/cold sores.  0  . cephALEXin (KEFLEX) 500 MG capsule Take 1 capsule (500 mg total) by mouth 3 (three) times daily. 30 capsule 0  . nystatin cream (MYCOSTATIN) Apply 1 application topically 2 (two) times daily. 30 g 1   No current facility-administered medications for this visit.     Physical Exam:  BP (!) 166/93   Pulse 80   Temp (!) 97.1 F (36.2 C) (Oral)   Resp 16   Ht 5\' 4"  (1.626 m)   Wt 190 lb (86.2 kg)   SpO2 98% Comment: ON RA  BMI 32.61 kg/m    Gen: no apparent distress Heart: RR Lungs: CTA bilaterally Wound: chest tube site is mildly erythematous with yeast appearance present, this extends under her breast as well.  Her surgical incision is erythematous with mild purulence noted.  There is a small area with stitch exposed   A/P:  1. Cellulitis of VATS incision- area cleaned and dry, stitch removed... Mild packing required of 0.5 x 0.5 cm area 2. Yeast infection of right breast and around chest tube site 3. HTN- patient became distressed and almost passed out when cleaning wound.. This was likely elevated due to being  nervous about appointment- instructed to follow up with PCP 4. DIspo- patient given RX for Keflex 500 mg TID for 10 days, Nystatin cream to apply BID to yeast infection... She was instructed to wash incision with soap and water daily... Keep very dry and apply clean dressing daily... She is scheduled to follow up with Dr. Roxan Hockey on 11/6 we will see her back then.  She was instructed to contact our office sooner should her wound worsen prior to that appointment  Ellwood Handler, PA-C [facility] [phone]

## 2017-04-13 ENCOUNTER — Other Ambulatory Visit: Payer: Self-pay | Admitting: Thoracic Surgery (Cardiothoracic Vascular Surgery)

## 2017-04-13 DIAGNOSIS — C3490 Malignant neoplasm of unspecified part of unspecified bronchus or lung: Secondary | ICD-10-CM

## 2017-04-14 ENCOUNTER — Ambulatory Visit
Admission: RE | Admit: 2017-04-14 | Discharge: 2017-04-14 | Disposition: A | Discharge status: Home / Self Care | Payer: BLUE CROSS/BLUE SHIELD | Source: Ambulatory Visit | Attending: Thoracic Surgery (Cardiothoracic Vascular Surgery) | Admitting: Thoracic Surgery (Cardiothoracic Vascular Surgery)

## 2017-04-14 ENCOUNTER — Ambulatory Visit (INDEPENDENT_AMBULATORY_CARE_PROVIDER_SITE_OTHER): Payer: Self-pay | Admitting: Thoracic Surgery (Cardiothoracic Vascular Surgery)

## 2017-04-14 VITALS — BP 166/95 | HR 75 | Resp 16 | Ht 64.0 in | Wt 190.0 lb

## 2017-04-14 DIAGNOSIS — C3490 Malignant neoplasm of unspecified part of unspecified bronchus or lung: Secondary | ICD-10-CM

## 2017-04-14 DIAGNOSIS — C3411 Malignant neoplasm of upper lobe, right bronchus or lung: Secondary | ICD-10-CM

## 2017-04-14 DIAGNOSIS — J939 Pneumothorax, unspecified: Secondary | ICD-10-CM | POA: Diagnosis not present

## 2017-04-14 DIAGNOSIS — Z09 Encounter for follow-up examination after completed treatment for conditions other than malignant neoplasm: Secondary | ICD-10-CM

## 2017-04-14 NOTE — Progress Notes (Signed)
[address]       [place] [address]             [phone]       HPI: Regina Black returns for a scheduled postoperative follow-up visit  Mrs. Syfert is a 61 year old woman with a remote history of tobacco abuse who had multiple groundglass opacities in her lungs bilaterally.  Navigational bronchoscopy was nondiagnostic.  Nodules were relatively stable on follow-up but she was anxious about them so she was referred for surgical biopsy.  I did a right VATS with wedge resection and lymph node sampling on 03/23/2017.  The right upper lobe nodule turned out to be an adenocarcinoma.  Her nodes were negative.  Postoperatively she did well and went home on day 3.  She was back in the office last week with cellulitis around the chest tube site.  She is on antibiotics for that.  It is improving.  She has some pain but has only taken narcotics a couple of nights before she is gone to bed to help her rest.  She uses Tylenol during the day.  She is not having any significant shortness of breath.  Past Medical History:  Diagnosis Date  . Adenocarcinoma of lung ([place]) 03/26/2017  . Allergy   . Anxiety   . Arrhythmia   . Asthma    as a child  . Blood transfusion without reported diagnosis   . Chicken pox   . Depression   . Diverticulosis 2010   Mild left sided by EGD/Colonoscopy Dr. Alice Reichert  . Eczema   . Endometriosis    not aware of this  . Fatty liver   . GERD (gastroesophageal reflux disease)   . Headache   . Heart murmur   . Hiatal hernia 2010   "small"  . History of cardiovascular stress test 04/2013   negative per OV note from Dr. Marijo File  . History of echocardiogram 04/2013   normal study; EF 75% per Dr. Marijo File  . History of EKG 2016   NSR with PVC, Dr. Marijo File  . History of Holter monitoring 2014   palpitations/pvc  . Hyperlipidemia   . Hypertension   . Hypokalemia   . Internal hemorrhoid 2010   small  . Osteoarthritis   . PCR DNA positive  for HSV1   . PONV (postoperative nausea and vomiting)   . Prediabetes   . PVC (premature ventricular contraction) 2014   palpitations  . Thyroid nodule   . Vertigo   . Vitamin D deficiency      Current Outpatient Medications  Medication Sig Dispense Refill  . acetaminophen (TYLENOL) 500 MG tablet Take 2 tablets (1,000 mg total) by mouth every 6 (six) hours as needed. 30 tablet 0  . cephALEXin (KEFLEX) 500 MG capsule Take 1 capsule (500 mg total) by mouth 3 (three) times daily. 30 capsule 0  . DULoxetine (CYMBALTA) 60 MG capsule Take 1 capsule (60 mg total) by mouth daily. Needs office visit prior to anymore refills 90 capsule 1  . fluticasone (FLONASE) 50 MCG/ACT nasal spray Place 2 sprays into both nostrils daily as needed (allergies/congestion).    . hydrochlorothiazide (HYDRODIURIL) 25 MG tablet Take 25 mg by mouth daily.    Marland Kitchen lisinopril (PRINIVIL,ZESTRIL) 40 MG tablet Take 40 mg by mouth daily.    Marland Kitchen loratadine (CLARITIN) 10 MG tablet Take 10 mg by mouth daily.    . metoprolol succinate (TOPROL-XL) 25 MG 24 hr tablet Take 3 tablets (  75 mg total) by mouth daily. Take with or immediately following a meal. (Patient taking differently: Take 50 mg by mouth daily. Take with or immediately following a meal.) 90 tablet 0  . nystatin cream (MYCOSTATIN) Apply 1 application topically 2 (two) times daily. 30 g 1  . Olopatadine HCl 0.2 % SOLN Apply 1 drop to eye daily. 1 drop each eye daily (Patient taking differently: Place 1 drop into both eyes daily as needed (eye allergies). 1 drop each eye daily) 1 Bottle 0  . omeprazole (PRILOSEC) 20 MG capsule Take 20 mg by mouth daily.     Marland Kitchen oxyCODONE (OXY IR/ROXICODONE) 5 MG immediate release tablet Take 1-2 tablets (5-10 mg total) by mouth every 4 (four) hours as needed for severe pain. 40 tablet 0  . penciclovir (DENAVIR) 1 % cream Apply 1 application topically every 4 (four) hours as needed (applied to lip area for recurrent cold sores).     .  valACYclovir (VALTREX) 1000 MG tablet Take 1 g by mouth 2 (two) times daily as needed. For fever blisters/cold sores.  0   No current facility-administered medications for this visit.     Physical Exam BP (!) 166/95 (BP Location: Right Arm, Patient Position: Sitting, Cuff Size: Large)   Pulse 75   Resp 16   Ht 5\' 4"  (1.626 m)   Wt 190 lb (86.2 kg)   SpO2 95% Comment: ON RA  BMI 32.2 kg/m  61 year old woman in no acute distress Alert and oriented x3 with deficits Lungs breath sounds diminished on right but otherwise clear Incision with 2 small open areas that are clean.  Mild erythema around chest tube site.  Diagnostic Tests: CHEST  2 VIEW  COMPARISON:  PA and lateral chest 03/26/2017.  FINDINGS: Asymmetric elevation of the right hemidiaphragm is again seen. Small left apical pneumothorax seen on the prior examination has resolved. Right basilar atelectasis and effusions seen on the prior study have also resolved. The left lung is expanded and clear. Heart size is normal.  IMPRESSION: Resolved small right pneumothorax, basilar atelectasis and fusion since the prior study. No acute disease.   Electronically Signed   By: Inge Rise M.D.   On: 04/14/2017 10:14 I personally reviewed the chest x-ray and concur with the findings noted above  Impression: Regina Black is a 61 year old woman who is now about 3 weeks out from a wedge resection of a groundglass opacity in the right upper lobe.  This turned out to be a moderately differentiated adenocarcinoma.  She has multiple other groundglass opacities with a high index of suspicion for multifocal adenocarcinoma in situ.  Molecular testing of the tumor is pending.  From a surgical standpoint she is doing well.  She has some elevation of the left hemidiaphragm but is relatively asymptomatic from that.  That should improve with time.  She has a cellulitis of her chest tube site.  The redness there is improving.  She does  not need further antibiotics.  She will keep an eye on that area.  She may begin driving.  Appropriate precautions were discussed.  Her activities are otherwise unrestricted but she was cautioned to build into new activities gradually.  Will refer to multidisciplinary thoracic oncology clinic to meet with oncology and radiation oncology to discuss management of additional nodules.  Hopefully will have the molecular testing results back by then.  Plan: Referral to [facility]  I will see her back in 2 months to check on her progress.  Revonda Standard  Roxan Hockey, MD [facility] [phone]

## 2017-04-15 ENCOUNTER — Telehealth: Payer: Self-pay | Admitting: *Deleted

## 2017-04-15 DIAGNOSIS — R918 Other nonspecific abnormal finding of lung field: Secondary | ICD-10-CM

## 2017-04-15 NOTE — Telephone Encounter (Signed)
Oncology Nurse Navigator Documentation  Oncology Nurse Navigator Flowsheets 04/15/2017  Navigator Location [facility]  Navigator Encounter Type Telephone/I received referral on Mr. Mallozzi. I called to schedule her to be seen at [facility] next week. I was unable to reach. I left vm message for her to call me.   Telephone Outgoing Call  Barriers/Navigation Needs Coordination of Care  Interventions Coordination of Care  Coordination of Care Appts  Acuity Level 1  Time Spent with Patient 15

## 2017-04-15 NOTE — Telephone Encounter (Signed)
Oncology Nurse Navigator Documentation  Oncology Nurse Navigator Flowsheets 04/15/2017  Navigator Location [facility]  Navigator Encounter Type Telephone/Ms. Weichert called me back today. I gave her an appt for Regina Black next week. She verbalized understanding of appt time and place.   Telephone Outgoing Call  Barriers/Navigation Needs Coordination of Care  Interventions Coordination of Care  Coordination of Care Appts  Acuity Level 1  Time Spent with Patient 15

## 2017-04-21 ENCOUNTER — Encounter (HOSPITAL_COMMUNITY): Payer: Self-pay

## 2017-04-22 ENCOUNTER — Telehealth: Payer: Self-pay | Admitting: Internal Medicine

## 2017-04-22 NOTE — Telephone Encounter (Signed)
Confirmed arrival time with patient for 04/23/17 [place]

## 2017-04-23 ENCOUNTER — Ambulatory Visit
Admission: RE | Admit: 2017-04-23 | Discharge: 2017-04-23 | Disposition: A | Discharge status: Home / Self Care | Payer: BLUE CROSS/BLUE SHIELD | Source: Ambulatory Visit | Attending: Radiation Oncology | Admitting: Radiation Oncology

## 2017-04-23 ENCOUNTER — Ambulatory Visit (HOSPITAL_BASED_OUTPATIENT_CLINIC_OR_DEPARTMENT_OTHER): Payer: BLUE CROSS/BLUE SHIELD | Admitting: Internal Medicine

## 2017-04-23 ENCOUNTER — Encounter: Payer: Self-pay | Admitting: Internal Medicine

## 2017-04-23 ENCOUNTER — Telehealth: Payer: Self-pay | Admitting: Internal Medicine

## 2017-04-23 ENCOUNTER — Other Ambulatory Visit (HOSPITAL_BASED_OUTPATIENT_CLINIC_OR_DEPARTMENT_OTHER): Payer: BLUE CROSS/BLUE SHIELD

## 2017-04-23 DIAGNOSIS — C3490 Malignant neoplasm of unspecified part of unspecified bronchus or lung: Secondary | ICD-10-CM

## 2017-04-23 DIAGNOSIS — I10 Essential (primary) hypertension: Secondary | ICD-10-CM | POA: Diagnosis not present

## 2017-04-23 DIAGNOSIS — Z889 Allergy status to unspecified drugs, medicaments and biological substances status: Secondary | ICD-10-CM | POA: Diagnosis not present

## 2017-04-23 DIAGNOSIS — J3089 Other allergic rhinitis: Secondary | ICD-10-CM | POA: Diagnosis not present

## 2017-04-23 DIAGNOSIS — C3411 Malignant neoplasm of upper lobe, right bronchus or lung: Secondary | ICD-10-CM

## 2017-04-23 DIAGNOSIS — M129 Arthropathy, unspecified: Secondary | ICD-10-CM | POA: Diagnosis not present

## 2017-04-23 DIAGNOSIS — C3491 Malignant neoplasm of unspecified part of right bronchus or lung: Secondary | ICD-10-CM

## 2017-04-23 DIAGNOSIS — Z87891 Personal history of nicotine dependence: Secondary | ICD-10-CM | POA: Diagnosis not present

## 2017-04-23 DIAGNOSIS — R911 Solitary pulmonary nodule: Secondary | ICD-10-CM | POA: Diagnosis not present

## 2017-04-23 DIAGNOSIS — R918 Other nonspecific abnormal finding of lung field: Secondary | ICD-10-CM

## 2017-04-23 DIAGNOSIS — Z23 Encounter for immunization: Secondary | ICD-10-CM | POA: Diagnosis not present

## 2017-04-23 LAB — COMPREHENSIVE METABOLIC PANEL
ALK PHOS: 123 U/L (ref 40–150)
ALT: 27 U/L (ref 0–55)
AST: 25 U/L (ref 5–34)
Albumin: 3.9 g/dL (ref 3.5–5.0)
Anion Gap: 11 mEq/L (ref 3–11)
BUN: 17.3 mg/dL (ref 7.0–26.0)
CHLORIDE: 101 meq/L (ref 98–109)
CO2: 25 meq/L (ref 22–29)
Calcium: 9.7 mg/dL (ref 8.4–10.4)
Creatinine: 0.9 mg/dL (ref 0.6–1.1)
GLUCOSE: 98 mg/dL (ref 70–140)
POTASSIUM: 3.5 meq/L (ref 3.5–5.1)
SODIUM: 137 meq/L (ref 136–145)
Total Bilirubin: 0.46 mg/dL (ref 0.20–1.20)
Total Protein: 8 g/dL (ref 6.4–8.3)

## 2017-04-23 LAB — CBC WITH DIFFERENTIAL/PLATELET
BASO%: 0.8 % (ref 0.0–2.0)
BASOS ABS: 0.1 10*3/uL (ref 0.0–0.1)
EOS ABS: 0.6 10*3/uL — AB (ref 0.0–0.5)
EOS%: 8.3 % — ABNORMAL HIGH (ref 0.0–7.0)
HEMATOCRIT: 40.7 % (ref 34.8–46.6)
HGB: 13.5 g/dL (ref 11.6–15.9)
LYMPH#: 2.5 10*3/uL (ref 0.9–3.3)
LYMPH%: 33.1 % (ref 14.0–49.7)
MCH: 29.8 pg (ref 25.1–34.0)
MCHC: 33.2 g/dL (ref 31.5–36.0)
MCV: 89.8 fL (ref 79.5–101.0)
MONO#: 0.5 10*3/uL (ref 0.1–0.9)
MONO%: 6.7 % (ref 0.0–14.0)
NEUT#: 3.9 10*3/uL (ref 1.5–6.5)
NEUT%: 51.1 % (ref 38.4–76.8)
PLATELETS: 320 10*3/uL (ref 145–400)
RBC: 4.53 10*6/uL (ref 3.70–5.45)
RDW: 13.6 % (ref 11.2–14.5)
WBC: 7.6 10*3/uL (ref 3.9–10.3)

## 2017-04-23 NOTE — Progress Notes (Signed)
Regina [phone]   [phone] Multidisciplinary thoracic oncology clinic  CONSULT NOTE  REFERRING PHYSICIAN: Dr. Modesto Charon  REASON FOR CONSULTATION:  61 years old white female recently diagnosed with lung cancer.  HPI Regina Black is a 61 y.o. female with past medical history significant for anxiety, asthma, depression, diverticulosis, arrhythmia, hypertension, dyslipidemia, vitamin D deficiency and GERD as well as history for smoking but quit in 2000.  The patient mentions that in 2016 she was complaining of uncontrolled hypertension and she was complaining of chest pain and shortness of breath.  She had a cardiac workup performed at that time and during her evaluation CT angiogram of the chest was performed on September 04, 2014 and that showed small areas of focal opacity noted in the upper lobes questionable for chronic areas of scarring but it may also reflect multifocal areas of active inflammation or infection.  The largest area was in the right upper lobe and measured 1.3 cm in transverse dimension.  There were other discrete nodules the largest in the right upper lobe near the minor fissure and measuring just under 4 mm. The patient was followed by regular CT scan of the chest and repeat CT scan of the chest on 01/21/2016 showed stable multiple bilateral groundglass pulmonary nodules measuring up to 1.5 cm in the right upper lobe.  Adenocarcinoma cannot be excluded.  The patient underwent flexible video fiberoptic bronchoscopy with electromagnetic navigation and biopsies under the care of Dr. Lamonte Sakai on 03/05/2016.  The final cytology and pathology from these biopsies were negative for malignancy. Repeat CT scan of the chest on 02/17/2017 showed stable appearance of the bilateral groundglass and part solid nodules compared to 07/18/2016 suspicious for multifocal pulmonary adenocarcinoma.  When compared to the study from 11/12/2014 the 1.4 cm  left upper lobe groundglass nodule had increased in size from 0.7 cm.  The other nodules have remained stable since that time. The patient was referred to Dr. Roxan Hockey and on March 23, 2017 she underwent right VATS with wedge resection of the right upper lobe nodule as well as lymph node sampling. The final pathology ([id number]) showed moderately differentiated adenocarcinoma measuring 1.6 cm.  The resection margin was negative for malignancy and there was no evidence for visceral pleural or lymphovascular invasion. The tissue block was sent to [facility] for molecular studies and it did not show any actionable mutations. Dr. Roxan Hockey kindly refer the patient to me today for evaluation and recommendation regarding her condition. When seen today the patient continues to have shortness of breath with exertion as well as soreness on the right side of the chest and dry cough with no hemoptysis.  She denied having any significant weight loss.  She has occasional nausea with no vomiting she also has constipation and currently on MiraLAX.  She denied having any headache or visual changes. Family history significant for mother with lung cancer and heart disease, father died from alcohol abuse and heart disease.  Paternal aunt had breast cancer. The patient is married and has no children.  She was accompanied today by her husband Regina Black and her niece Regina Black.  The patient works at [employer].  She has a history of smoking more than 1 pack/day for around 20 years but quit in 2000.  She drinks alcohol occasionally and no history of drug abuse.  HPI  Past Medical History:  Diagnosis Date  . Adenocarcinoma of lung ([place]) 03/26/2017  . Allergy   .  Anxiety   . Arrhythmia   . Asthma    as a child  . Blood transfusion without reported diagnosis   . Chicken pox   . Depression   . Diverticulosis 2010   Mild left sided by EGD/Colonoscopy Dr. Alice Reichert  . Eczema   . Endometriosis    not aware of  this  . Fatty liver   . GERD (gastroesophageal reflux disease)   . Headache   . Heart murmur   . Hiatal hernia 2010   "small"  . History of cardiovascular stress test 04/2013   negative per OV note from Dr. Marijo File  . History of echocardiogram 04/2013   normal study; EF 75% per Dr. Marijo File  . History of EKG 2016   NSR with PVC, Dr. Marijo File  . History of Holter monitoring 2014   palpitations/pvc  . Hyperlipidemia   . Hypertension   . Hypokalemia   . Internal hemorrhoid 2010   small  . Osteoarthritis   . PCR DNA positive for HSV1   . PONV (postoperative nausea and vomiting)   . Prediabetes   . PVC (premature ventricular contraction) 2014   palpitations  . Thyroid nodule   . Vertigo   . Vitamin D deficiency     Past Surgical History:  Procedure Laterality Date  . BREAST SURGERY     reduction  . COLONOSCOPY  2010  . ESOPHAGOGASTRODUODENOSCOPY  2010  . EXPLORATORY LAPAROTOMY     endometriosis  . LASIK    . RECTAL SURGERY    . RECTOCELE REPAIR  07/2014  . thyroid nodule asp  01/11/2013  . TONSILLECTOMY AND ADENOIDECTOMY    . TUBAL LIGATION    . VIDEO ASSISTED THORACOSCOPY (VATS)/WEDGE RESECTION Right 03/23/2017   Procedure: VIDEO ASSISTED THORACOSCOPY (VATS)/WEDGE RESECTION;  Surgeon: [name], MD;  Location: [facility];  Service: Thoracic;  Laterality: Right;  Marland Kitchen VIDEO BRONCHOSCOPY WITH ENDOBRONCHIAL NAVIGATION N/A 03/05/2016   Procedure: VIDEO BRONCHOSCOPY WITH ENDOBRONCHIAL NAVIGATION;  Surgeon: Collene Gobble, MD;  Location: [facility] OR;  Service: Thoracic;  Laterality: N/A;    Family History  Problem Relation Age of Onset  . Lung cancer Mother   . Heart disease Mother   . Alcohol abuse Father   . Heart disease Father   . Breast cancer Paternal Aunt 57  . Heart disease Maternal Grandmother   . Arthritis Maternal Grandmother   . Arthritis Maternal Grandfather   . Heart disease Maternal Grandfather   . Hearing loss Maternal Grandfather   . Stroke Maternal  Grandfather   . Diabetes Paternal Grandmother   . Hearing loss Paternal Grandfather     Social History Social History   Tobacco Use  . Smoking status: Former Smoker    Packs/day: 2.00    Years: 25.00    Pack years: 50.00    Types: Cigarettes    Last attempt to quit: 06/09/1998    Years since quitting: 18.8  . Smokeless tobacco: Never Used  Substance Use Topics  . Alcohol use: Yes    Alcohol/week: 1.2 oz    Types: 2 Glasses of wine per week  . Drug use: No    Allergies  Allergen Reactions  . Other Other (See Comments)    Pt was young adult, had tonsils removed - pt hemorrhaged after the procedure   . Butorphanol Other (See Comments)    Syncope UNABLE TO MOVE LIMBS  . Codeine Nausea And Vomiting    Current Outpatient Medications  Medication Sig Dispense Refill  .  acetaminophen (TYLENOL) 500 MG tablet Take 2 tablets (1,000 mg total) by mouth every 6 (six) hours as needed. 30 tablet 0  . cephALEXin (KEFLEX) 500 MG capsule Take 1 capsule (500 mg total) by mouth 3 (three) times daily. 30 capsule 0  . DULoxetine (CYMBALTA) 60 MG capsule Take 1 capsule (60 mg total) by mouth daily. Needs office visit prior to anymore refills 90 capsule 1  . fluticasone (FLONASE) 50 MCG/ACT nasal spray Place 2 sprays into both nostrils daily as needed (allergies/congestion).    . hydrochlorothiazide (HYDRODIURIL) 25 MG tablet Take 25 mg by mouth daily.    Marland Kitchen lisinopril (PRINIVIL,ZESTRIL) 40 MG tablet Take 40 mg by mouth daily.    Marland Kitchen loratadine (CLARITIN) 10 MG tablet Take 10 mg by mouth daily.    . metoprolol succinate (TOPROL-XL) 25 MG 24 hr tablet Take 3 tablets (75 mg total) by mouth daily. Take with or immediately following a meal. (Patient taking differently: Take 50 mg by mouth daily. Take with or immediately following a meal.) 90 tablet 0  . nystatin cream (MYCOSTATIN) Apply 1 application topically 2 (two) times daily. 30 g 1  . Olopatadine HCl 0.2 % SOLN Apply 1 drop to eye daily. 1 drop each  eye daily (Patient taking differently: Place 1 drop into both eyes daily as needed (eye allergies). 1 drop each eye daily) 1 Bottle 0  . omeprazole (PRILOSEC) 20 MG capsule Take 20 mg by mouth daily.     Marland Kitchen oxyCODONE (OXY IR/ROXICODONE) 5 MG immediate release tablet Take 1-2 tablets (5-10 mg total) by mouth every 4 (four) hours as needed for severe pain. 40 tablet 0  . penciclovir (DENAVIR) 1 % cream Apply 1 application topically every 4 (four) hours as needed (applied to lip area for recurrent cold sores).     . valACYclovir (VALTREX) 1000 MG tablet Take 1 g by mouth 2 (two) times daily as needed. For fever blisters/cold sores.  0   No current facility-administered medications for this visit.     Review of Systems  Constitutional: negative Eyes: negative Ears, nose, mouth, throat, and face: negative Respiratory: positive for cough and dyspnea on exertion Cardiovascular: negative Gastrointestinal: negative Genitourinary:negative Integument/breast: negative Hematologic/lymphatic: negative Musculoskeletal:negative Neurological: negative Behavioral/Psych: negative Endocrine: negative Allergic/Immunologic: negative  Physical Exam  GGE:ZMOQH, healthy, no distress, well nourished and well developed SKIN: skin color, texture, turgor are normal, no rashes or significant lesions HEAD: Normocephalic, No masses, lesions, tenderness or abnormalities EYES: normal, PERRLA, Conjunctiva are pink and non-injected EARS: External ears normal, Canals clear OROPHARYNX:no exudate, no erythema and lips, buccal mucosa, and tongue normal  NECK: supple, no adenopathy, no JVD LYMPH:  no palpable lymphadenopathy, no hepatosplenomegaly BREAST:not examined LUNGS: clear to auscultation , and palpation HEART: regular rate & rhythm, no murmurs and no gallops ABDOMEN:abdomen soft, non-tender, normal bowel sounds and no masses or organomegaly BACK: Back symmetric, no curvature., No CVA tenderness EXTREMITIES:no  joint deformities, effusion, or inflammation, no edema, no skin discoloration  NEURO: alert & oriented x 3 with fluent speech, no focal motor/sensory deficits  PERFORMANCE STATUS: ECOG 1  LABORATORY DATA: Lab Results  Component Value Date   WBC 9.2 03/25/2017   HGB 10.8 (L) 03/25/2017   HCT 33.8 (L) 03/25/2017   MCV 92.3 03/25/2017   PLT 223 03/25/2017      Chemistry      Component Value Date/Time   NA 138 03/25/2017 0523   NA 140 09/27/2016   K 3.5 03/25/2017 0523   CL  105 03/25/2017 0523   CO2 27 03/25/2017 0523   BUN 13 03/25/2017 0523   BUN 21 09/27/2016   CREATININE 0.84 03/25/2017 0523   GLU 104 09/27/2016      Component Value Date/Time   CALCIUM 8.6 (L) 03/25/2017 0523   ALKPHOS 74 03/25/2017 0523   AST 25 03/25/2017 0523   ALT 19 03/25/2017 0523   BILITOT 0.5 03/25/2017 0523       RADIOGRAPHIC STUDIES: Dg Chest 2 View  Result Date: 04/14/2017 CLINICAL DATA:  Patient status post right upper lobe wedge resection for adenocarcinoma 03/23/2017. EXAM: CHEST  2 VIEW COMPARISON:  PA and lateral chest 03/26/2017. FINDINGS: Asymmetric elevation of the right hemidiaphragm is again seen. Small left apical pneumothorax seen on the prior examination has resolved. Right basilar atelectasis and effusions seen on the prior study have also resolved. The left lung is expanded and clear. Heart size is normal. IMPRESSION: Resolved small right pneumothorax, basilar atelectasis and fusion since the prior study. No acute disease. Electronically Signed   By: Inge Rise M.D.   On: 04/14/2017 10:14   Dg Chest 2 View  Result Date: 03/26/2017 CLINICAL DATA:  Chest tube removal.  Pneumothorax. EXAM: CHEST  2 VIEW COMPARISON:  03/25/2017 FINDINGS: Right apical pneumothorax unchanged approximately 25 mm. Surgical clips in the right apex. Right upper lobe airspace disease unchanged. Progression of right lower lobe atelectasis with elevated right hemidiaphragm. Left lung remains clear.  Central venous catheter tip in the SVC. IMPRESSION: 25 mm right apical pneumothorax unchanged. Airspace disease right upper lobe unchanged. Increase in right lower lobe atelectasis. Electronically Signed   By: Franchot Gallo M.D.   On: 03/26/2017 07:32   Dg Chest Port 1 View  Result Date: 03/25/2017 CLINICAL DATA:  Encounter for chest tube placement. EXAM: PORTABLE CHEST 1 VIEW COMPARISON:  Radiograph of same day. FINDINGS: Stable cardiomediastinal silhouette. Left lung is clear. Elevated right hemidiaphragm is noted with mild bibasilar subsegmental atelectasis. Right-sided chest tube has been removed. Mild right apical pneumothorax is noted which is not significantly changed. Postsurgical changes are noted in the right upper lobe. Bony thorax is unremarkable. Bony thorax is unremarkable. Stable position of right internal jugular catheter. IMPRESSION: Right-sided chest tube has been removed. Mild right apical pneumothorax is noted and unchanged. Stable elevated right hemidiaphragm is noted. Stable right upper lobe postsurgical changes are noted. Electronically Signed   By: Marijo Conception, M.D.   On: 03/25/2017 12:02   Dg Chest Port 1 View  Result Date: 03/25/2017 CLINICAL DATA:  Right side chest tube EXAM: PORTABLE CHEST 1 VIEW COMPARISON:  03/24/2017 FINDINGS: Right chest tube remains in place. Small right apical pneumothorax, new since prior study, approximately 10%. Elevation of the right hemidiaphragm with right base and right upper lobe airspace opacities, atelectasis and likely postoperative changes in the right upper lung. Linear atelectasis at the left base. Mild cardiomegaly. IMPRESSION: New approximately 10% right pneumothorax. Right chest tube remains in place. Postoperative changes on the right with volume loss. Bibasilar atelectasis. Electronically Signed   By: Rolm Baptise M.D.   On: 03/25/2017 08:21    ASSESSMENT: This is a very pleasant 61 years old white female recently diagnosed with  a stage IA (T1a, N0, M0) non-small cell lung cancer, moderately differentiated adenocarcinoma status post wedge resection of the right upper lobe with lymph node sampling.  The patient also has multifocal lung opacities suspicious for multifocal low-grade adenocarcinoma.  Her molecular studies were negative for actionable mutations.  PLAN: I had a lengthy discussion with the patient and her family about her current disease is stage, prognosis and treatment options. I personally and independently reviewed the imaging studies and discussed the results and showed the images to the patient and her family. I explained to the patient that it is likely that the other pulmonary nodules are consistent with low-grade adenocarcinoma.  This nodule has been stable for a long time except for the enlarging left upper lobe nodule. I discussed with the patient several options for management of her condition including continuous observation and close monitoring for the slowly growing pulmonary nodules versus consideration of treatment with systemic chemotherapy versus stereotactic radiation or surgical resection again to any of these nodules start increasing in size in the future. Unfortunately the patient has no actionable mutations and she would not be a candidate for targeted therapy at this point. The patient prefer to continue on observation for now.  I will see her back for follow-up visit in 6 months with a repeat CT scan of the chest for restaging of her disease. She was advised to call immediately if she has any concerning symptoms in the interval. The patient voices understanding of current disease status and treatment options and is in agreement with the current care plan. The patient was seen during the multidisciplinary thoracic oncology clinic today by medical oncology, radiation oncology, thoracic navigator and physical therapist. All questions were answered. The patient knows to call the clinic with any  problems, questions or concerns. We can certainly see the patient much sooner if necessary.  Thank you so much for allowing me to participate in the care of [facility]. I will continue to follow up the patient with you and assist in her care.  I spent 40 minutes counseling the patient face to face. The total time spent in the appointment was 60 minutes.  Disclaimer: This note was dictated with voice recognition software. Similar sounding words can inadvertently be transcribed and may not be corrected upon review.   Eilleen Kempf April 23, 2017, 1:57 PM

## 2017-04-23 NOTE — Telephone Encounter (Signed)
Gave patient AVS and calendar of upcoming may 2019 appointments.

## 2017-04-24 NOTE — Progress Notes (Signed)
Radiation Oncology         [phone] ________________________________  Name: Regina Black        MRN: [id number]  Date of Service: 04/23/2017 DOB: [birth year]  LP:[id number], Regina Raddle, DO  Regina Black, *     REFERRING PHYSICIAN: Melrose Black, *   DIAGNOSIS: The encounter diagnosis was Adenocarcinoma of right lung ([place]).   HISTORY OF PRESENT ILLNESS: Regina Black is a 61 y.o. female seen at the request of Dr. Roxan Hockey for a recent history since 2016 of bilateral pulmonary nodules which have been followed by pulmonology. There was a change between February and September of this year in the RUL and LUL. She underwent VATS with RUL wedge resection on 03/23/17, and her nodes were negative, and the RUL tumor measured 1.6 cm and consistent with adenocarcinoma. She comes today to discuss options of managing her 14 mm LUL that had been 7 mm in February.     PREVIOUS RADIATION THERAPY: No   PAST MEDICAL HISTORY:  Past Medical History:  Diagnosis Date  . Adenocarcinoma of lung ([place]) 03/26/2017  . Allergy   . Anxiety   . Arrhythmia   . Asthma    as a child  . Blood transfusion without reported diagnosis   . Chicken pox   . Depression   . Diverticulosis 2010   Mild left sided by EGD/Colonoscopy Dr. Alice Reichert  . Eczema   . Endometriosis    not aware of this  . Fatty liver   . GERD (gastroesophageal reflux disease)   . Headache   . Heart murmur   . Hiatal hernia 2010   "small"  . History of cardiovascular stress test 04/2013   negative per OV note from Dr. Marijo File  . History of echocardiogram 04/2013   normal study; EF 75% per Dr. Marijo File  . History of EKG 2016   NSR with PVC, Dr. Marijo File  . History of Holter monitoring 2014   palpitations/pvc  . Hyperlipidemia   . Hypertension   . Hypokalemia   . Internal hemorrhoid 2010   small  . Osteoarthritis   . PCR DNA positive for HSV1   . PONV (postoperative nausea and vomiting)   .  Prediabetes   . PVC (premature ventricular contraction) 2014   palpitations  . Thyroid nodule   . Vertigo   . Vitamin D deficiency        PAST SURGICAL HISTORY: Past Surgical History:  Procedure Laterality Date  . BREAST SURGERY     reduction  . COLONOSCOPY  2010  . ESOPHAGOGASTRODUODENOSCOPY  2010  . EXPLORATORY LAPAROTOMY     endometriosis  . LASIK    . RECTAL SURGERY    . RECTOCELE REPAIR  07/2014  . thyroid nodule asp  01/11/2013  . TONSILLECTOMY AND ADENOIDECTOMY    . TUBAL LIGATION    . VIDEO ASSISTED THORACOSCOPY (VATS)/WEDGE RESECTION Right 03/23/2017   Procedure: VIDEO ASSISTED THORACOSCOPY (VATS)/WEDGE RESECTION;  Surgeon: Regina Nakayama, MD;  Location: [facility];  Service: Thoracic;  Laterality: Right;  Marland Kitchen VIDEO BRONCHOSCOPY WITH ENDOBRONCHIAL NAVIGATION N/A 03/05/2016   Procedure: VIDEO BRONCHOSCOPY WITH ENDOBRONCHIAL NAVIGATION;  Surgeon: Collene Gobble, MD;  Location: [facility];  Service: Thoracic;  Laterality: N/A;     FAMILY HISTORY:  Family History  Problem Relation Age of Onset  . Lung cancer Mother   . Heart disease Mother   . Alcohol abuse Father   . Heart disease Father   . Breast cancer Paternal  Aunt 60  . Heart disease Maternal Grandmother   . Arthritis Maternal Grandmother   . Arthritis Maternal Grandfather   . Heart disease Maternal Grandfather   . Hearing loss Maternal Grandfather   . Stroke Maternal Grandfather   . Diabetes Paternal Grandmother   . Hearing loss Paternal Grandfather      SOCIAL HISTORY:  reports that she quit smoking about 18 years ago. Her smoking use included cigarettes. She has a 50.00 pack-year smoking history. she has never used smokeless tobacco. She reports that she drinks about 1.2 oz of alcohol per week. She reports that she does not use drugs. The patient is married and lives in [place]. She is also accompanied by her sister.   ALLERGIES: Other; Butorphanol; and Codeine   MEDICATIONS:  Current Outpatient  Medications  Medication Sig Dispense Refill  . acetaminophen (TYLENOL) 500 MG tablet Take 2 tablets (1,000 mg total) by mouth every 6 (six) hours as needed. (Patient not taking: Reported on 04/23/2017) 30 tablet 0  . diphenhydrAMINE (BENADRYL) 25 MG tablet Take 50 mg at bedtime as needed by mouth.    . DULoxetine (CYMBALTA) 60 MG capsule Take 1 capsule (60 mg total) by mouth daily. Needs office visit prior to anymore refills 90 capsule 1  . fluticasone (FLONASE) 50 MCG/ACT nasal spray Place 2 sprays into both nostrils daily as needed (allergies/congestion).    . hydrochlorothiazide (HYDRODIURIL) 25 MG tablet Take 25 mg by mouth daily.    Marland Kitchen ibuprofen (ADVIL,MOTRIN) 800 MG tablet Take 800 mg by mouth.    Marland Kitchen lisinopril (PRINIVIL,ZESTRIL) 40 MG tablet Take 40 mg by mouth daily.    Marland Kitchen loratadine (CLARITIN) 10 MG tablet Take 10 mg by mouth daily.    . metoprolol succinate (TOPROL-XL) 25 MG 24 hr tablet Take 3 tablets (75 mg total) by mouth daily. Take with or immediately following a meal. (Patient taking differently: Take 50 mg by mouth daily. Take with or immediately following a meal.) 90 tablet 0  . nystatin cream (MYCOSTATIN) Apply 1 application topically 2 (two) times daily. (Patient not taking: Reported on 04/23/2017) 30 g 1  . Olopatadine HCl 0.2 % SOLN Apply 1 drop to eye daily. 1 drop each eye daily (Patient taking differently: Place 1 drop into both eyes daily as needed (eye allergies). 1 drop each eye daily) 1 Bottle 0  . omeprazole (PRILOSEC) 20 MG capsule Take 20 mg by mouth daily.     Marland Kitchen oxyCODONE (OXY IR/ROXICODONE) 5 MG immediate release tablet Take 1-2 tablets (5-10 mg total) by mouth every 4 (four) hours as needed for severe pain. (Patient not taking: Reported on 04/23/2017) 40 tablet 0  . penciclovir (DENAVIR) 1 % cream Apply 1 application topically every 4 (four) hours as needed (applied to lip area for recurrent cold sores).     . polyethylene glycol (MIRALAX / GLYCOLAX) packet Take 17 g  daily by mouth.    . valACYclovir (VALTREX) 1000 MG tablet Take 1 g by mouth 2 (two) times daily as needed. For fever blisters/cold sores.  0   No current facility-administered medications for this encounter.      REVIEW OF SYSTEMS: On review of systems, the patient reports that she is doing well overall. She reports she's healing well. She's not having fevers, chills, or new symptoms since her surgery. No other complaints are noted.    PHYSICAL EXAM:  Wt Readings from Last 3 Encounters:  04/23/17 188 lb 8 oz (85.5 kg)  04/14/17 190 lb (86.2  kg)  04/06/17 190 lb (86.2 kg)   Temp Readings from Last 3 Encounters:  04/23/17 98.1 F (36.7 C) (Oral)  04/06/17 (!) 97.1 F (36.2 C) (Oral)  03/26/17 98.4 F (36.9 C) (Oral)   BP Readings from Last 3 Encounters:  04/23/17 121/87  04/14/17 (!) 166/95  04/06/17 (!) 166/93   Pulse Readings from Last 3 Encounters:  04/23/17 85  04/14/17 75  04/06/17 80    In general this is a well appearing caucasian female in no acute distress. She's alert and oriented x4 and appropriate throughout the examination. Cardiopulmonary assessment is negative for acute distress and she exhibits normal effort.     ECOG = 1  0 - Asymptomatic (Fully active, able to carry on all predisease activities without restriction)  1 - Symptomatic but completely ambulatory (Restricted in physically strenuous activity but ambulatory and able to carry out work of a light or sedentary nature. For example, light housework, office work)  2 - Symptomatic, <50% in bed during the day (Ambulatory and capable of all self care but unable to carry out any work activities. Up and about more than 50% of waking hours)  3 - Symptomatic, >50% in bed, but not bedbound (Capable of only limited self-care, confined to bed or chair 50% or more of waking hours)  4 - Bedbound (Completely disabled. Cannot carry on any self-care. Totally confined to bed or chair)  5 - Death   Eustace Pen MM,  Creech RH, Tormey DC, et al. [phone]). "Toxicity and response criteria of the [facility]". [place] Oncol. 5 (6): 649-55    LABORATORY DATA:  Lab Results  Component Value Date   WBC 7.6 04/23/2017   HGB 13.5 04/23/2017   HCT 40.7 04/23/2017   MCV 89.8 04/23/2017   PLT 320 04/23/2017   Lab Results  Component Value Date   NA 137 04/23/2017   K 3.5 04/23/2017   CL 105 03/25/2017   CO2 25 04/23/2017   Lab Results  Component Value Date   ALT 27 04/23/2017   AST 25 04/23/2017   ALKPHOS 123 04/23/2017   BILITOT 0.46 04/23/2017      RADIOGRAPHY: Dg Chest 2 View  Result Date: 04/14/2017 CLINICAL DATA:  Patient status post right upper lobe wedge resection for adenocarcinoma 03/23/2017. EXAM: CHEST  2 VIEW COMPARISON:  PA and lateral chest 03/26/2017. FINDINGS: Asymmetric elevation of the right hemidiaphragm is again seen. Small left apical pneumothorax seen on the prior examination has resolved. Right basilar atelectasis and effusions seen on the prior study have also resolved. The left lung is expanded and clear. Heart size is normal. IMPRESSION: Resolved small right pneumothorax, basilar atelectasis and fusion since the prior study. No acute disease. Electronically Signed   By: Inge Rise M.D.   On: 04/14/2017 10:14   Dg Chest 2 View  Result Date: 03/26/2017 CLINICAL DATA:  Chest tube removal.  Pneumothorax. EXAM: CHEST  2 VIEW COMPARISON:  03/25/2017 FINDINGS: Right apical pneumothorax unchanged approximately 25 mm. Surgical clips in the right apex. Right upper lobe airspace disease unchanged. Progression of right lower lobe atelectasis with elevated right hemidiaphragm. Left lung remains clear. Central venous catheter tip in the SVC. IMPRESSION: 25 mm right apical pneumothorax unchanged. Airspace disease right upper lobe unchanged. Increase in right lower lobe atelectasis. Electronically Signed   By: Franchot Gallo M.D.   On: 03/26/2017 07:32   Dg Chest  Port 1 View  Result Date: 03/25/2017 CLINICAL DATA:  Encounter for  chest tube placement. EXAM: PORTABLE CHEST 1 VIEW COMPARISON:  Radiograph of same day. FINDINGS: Stable cardiomediastinal silhouette. Left lung is clear. Elevated right hemidiaphragm is noted with mild bibasilar subsegmental atelectasis. Right-sided chest tube has been removed. Mild right apical pneumothorax is noted which is not significantly changed. Postsurgical changes are noted in the right upper lobe. Bony thorax is unremarkable. Bony thorax is unremarkable. Stable position of right internal jugular catheter. IMPRESSION: Right-sided chest tube has been removed. Mild right apical pneumothorax is noted and unchanged. Stable elevated right hemidiaphragm is noted. Stable right upper lobe postsurgical changes are noted. Electronically Signed   By: Marijo Conception, M.D.   On: 03/25/2017 12:02   Dg Chest Port 1 View  Result Date: 03/25/2017 CLINICAL DATA:  Right side chest tube EXAM: PORTABLE CHEST 1 VIEW COMPARISON:  03/24/2017 FINDINGS: Right chest tube remains in place. Small right apical pneumothorax, new since prior study, approximately 10%. Elevation of the right hemidiaphragm with right base and right upper lobe airspace opacities, atelectasis and likely postoperative changes in the right upper lung. Linear atelectasis at the left base. Mild cardiomegaly. IMPRESSION: New approximately 10% right pneumothorax. Right chest tube remains in place. Postoperative changes on the right with volume loss. Bibasilar atelectasis. Electronically Signed   By: Rolm Baptise M.D.   On: 03/25/2017 08:21       IMPRESSION/PLAN: 1. Stage IA, cT1bN0, NSCLC, adenocarcinoma of the RUL with LUL nodule. We spent time today reviewing her case including pathology and prior imaging. She is still healing at this point and a PET scan at this point would not be recommended as it would most likely indicate inflammatory postop changes which could obscure our  understanding of true metabolic changes versus inflammatory/infectious changes. Rather, she was given the option to proceed with repeat CT scan at a 3-6 month interval to compare the remaining nodules she's been followed for including the LUL nodule. She is counseled as well that the NCCN guidelines recommend scans of her chest q6 months for the next 5 years, and annually thereafter. I will set a calendar reminder to follow up with her next scan, and we would be happy to see her to discuss the utility of stereotactic radiotherapy if indicated in the future. We reviewed the delivery and logistics of treatment as well as the risks, benefits, short, and long term effects. She is in agreement, and was given our contact information if she has questions after today's visit.  In a visit lasting 30 minutes, greater than 50% of the time was spent face to face discussing her course, and coordinating the patient's care.     Carola Rhine, PAC

## 2017-04-28 ENCOUNTER — Telehealth: Payer: Self-pay | Admitting: *Deleted

## 2017-04-28 NOTE — Telephone Encounter (Signed)
Oncology Nurse Navigator Documentation  Oncology Nurse Navigator Flowsheets 04/28/2017  Navigator Location [facility]  Navigator Encounter Type Telephone/I called to follow up with Ms. Newburn.  I was unable to reach her. I did leave a vm message to call me if needed.   Telephone Outgoing Call  [employer] Needs Education  Education Other  Interventions Education  Education Method Verbal  Acuity Level 1  Time Spent with Patient 15

## 2017-05-04 ENCOUNTER — Telehealth: Payer: Self-pay | Admitting: *Deleted

## 2017-05-04 ENCOUNTER — Encounter: Payer: Self-pay | Admitting: *Deleted

## 2017-05-04 DIAGNOSIS — C3491 Malignant neoplasm of unspecified part of right bronchus or lung: Secondary | ICD-10-CM

## 2017-05-04 NOTE — Progress Notes (Signed)
Oncology Nurse Navigator Documentation  Oncology Nurse Navigator Flowsheets 05/04/2017  Navigator Location [facility]  Navigator Encounter Type Telephone  Telephone Incoming Call/I received a call from Ms. Pree today.  She had some concerns and I listened as she explained. She would like to have a CT scan earlier than 6 months. She is concerns about the other lung nodule. I spoke with Dr. Julien Nordmann and he is ok with an earlier scan.  I completed order. I called Ms. Lipton back to update. She was thankful for the call. I will update scheduling to call and arrange labs to be done before ct scan.   Abnormal Finding Date 02/17/2017  Confirmed Diagnosis Date 03/23/2017  Surgery Date 03/23/2017  Multidisiplinary Clinic Date 04/23/2017  Treatment Initiated Date 03/23/2017  Treatment Phase Follow-up  Barriers/Navigation Needs Coordination of Care  Interventions Coordination of Care  Coordination of Care Other  Acuity Level 2  Acuity Level 2 Educational needs;Other  Time Spent with Patient 45

## 2017-05-07 ENCOUNTER — Telehealth: Payer: Self-pay | Admitting: Internal Medicine

## 2017-05-07 NOTE — Telephone Encounter (Signed)
Spoke to patient regarding upcoming December appointments.  °

## 2017-05-25 ENCOUNTER — Telehealth: Payer: Self-pay | Admitting: *Deleted

## 2017-05-25 NOTE — Telephone Encounter (Signed)
Oncology Nurse Navigator Documentation  Oncology Nurse Navigator Flowsheets 05/25/2017  Navigator Location [facility]  Navigator Encounter Type Telephone;Other/I followed up on Ms. Ulibarri's schedule. Her ct chest was not scheduled. I called central scheduling and got this scheduled. I then called patient to update her on ct scan appt and pre-procedure instructions. I also gave her a follow up with Dr. Julien Nordmann. She was thankful for the call.   Telephone Outgoing Call  Treatment Phase Follow-up  Barriers/Navigation Needs Coordination of Care;Education  Education Other  Interventions Coordination of Care;Education  Coordination of Care Appts;Other  Education Method Verbal  Acuity Level 2  Acuity Level 2 Educational needs;Other  Time Spent with Patient 30

## 2017-05-28 ENCOUNTER — Ambulatory Visit (HOSPITAL_BASED_OUTPATIENT_CLINIC_OR_DEPARTMENT_OTHER): Payer: BLUE CROSS/BLUE SHIELD | Admitting: Medical

## 2017-05-28 ENCOUNTER — Ambulatory Visit (HOSPITAL_COMMUNITY)
Admission: RE | Admit: 2017-05-28 | Discharge: 2017-05-28 | Disposition: A | Discharge status: Home / Self Care | Payer: BLUE CROSS/BLUE SHIELD | Source: Ambulatory Visit | Attending: Medical | Admitting: Medical

## 2017-05-28 ENCOUNTER — Ambulatory Visit (HOSPITAL_BASED_OUTPATIENT_CLINIC_OR_DEPARTMENT_OTHER): Payer: BLUE CROSS/BLUE SHIELD

## 2017-05-28 ENCOUNTER — Telehealth: Payer: Self-pay | Admitting: Medical Oncology

## 2017-05-28 ENCOUNTER — Other Ambulatory Visit: Payer: Self-pay | Admitting: Medical Oncology

## 2017-05-28 VITALS — BP 146/82 | HR 88 | Temp 98.6°F | Resp 19 | Ht 64.0 in | Wt 191.4 lb

## 2017-05-28 DIAGNOSIS — C3491 Malignant neoplasm of unspecified part of right bronchus or lung: Secondary | ICD-10-CM

## 2017-05-28 DIAGNOSIS — J40 Bronchitis, not specified as acute or chronic: Secondary | ICD-10-CM

## 2017-05-28 DIAGNOSIS — C3411 Malignant neoplasm of upper lobe, right bronchus or lung: Secondary | ICD-10-CM

## 2017-05-28 DIAGNOSIS — R05 Cough: Secondary | ICD-10-CM

## 2017-05-28 DIAGNOSIS — R0602 Shortness of breath: Secondary | ICD-10-CM | POA: Diagnosis not present

## 2017-05-28 LAB — CBC WITH DIFFERENTIAL/PLATELET
BASO%: 1 % (ref 0.0–2.0)
BASOS ABS: 0.1 10*3/uL (ref 0.0–0.1)
EOS%: 10.5 % — AB (ref 0.0–7.0)
Eosinophils Absolute: 0.9 10*3/uL — ABNORMAL HIGH (ref 0.0–0.5)
HCT: 41.3 % (ref 34.8–46.6)
HEMOGLOBIN: 13.9 g/dL (ref 11.6–15.9)
LYMPH#: 2.6 10*3/uL (ref 0.9–3.3)
LYMPH%: 32.4 % (ref 14.0–49.7)
MCH: 30 pg (ref 25.1–34.0)
MCHC: 33.7 g/dL (ref 31.5–36.0)
MCV: 89.2 fL (ref 79.5–101.0)
MONO#: 0.5 10*3/uL (ref 0.1–0.9)
MONO%: 5.9 % (ref 0.0–14.0)
NEUT%: 50.2 % (ref 38.4–76.8)
NEUTROS ABS: 4.1 10*3/uL (ref 1.5–6.5)
NRBC: 0 % (ref 0–0)
Platelets: 323 10*3/uL (ref 145–400)
RBC: 4.63 10*6/uL (ref 3.70–5.45)
RDW: 13.4 % (ref 11.2–14.5)
WBC: 8.1 10*3/uL (ref 3.9–10.3)

## 2017-05-28 MED ORDER — ALBUTEROL SULFATE HFA 108 (90 BASE) MCG/ACT IN AERS: 2.0000 | INHALATION_SPRAY | Freq: Four times a day (QID) | RESPIRATORY_TRACT | 1 refills | Status: AC | PRN

## 2017-05-28 MED ORDER — AZITHROMYCIN 250 MG PO TABS: ORAL_TABLET | ORAL | 0 refills | Status: DC

## 2017-05-28 MED ORDER — HYDROCOD POLST-CPM POLST ER 10-8 MG/5ML PO SUER: 5.0000 mL | Freq: Two times a day (BID) | ORAL | 0 refills | Status: DC | PRN

## 2017-05-28 NOTE — Telephone Encounter (Signed)
Cough and pain behind right breast when she coughs since last week . Taking delsym and is coughing up some "greenish black" sputum. Per Julien Nordmann see [facility] , PEr Sandi Mealy cxr and cbc today . All scheduled and ordered. Pt aware.

## 2017-05-29 NOTE — Progress Notes (Signed)
[facility] Progress Note   Regina Black [id number] 1956-05-19 61 y.o.  Regina Black is managed by Dr. Fanny Bien. Mohamed  Actively treated with chemotherapy: no   Assessment: Plan:    Bronchitis - Plan: azithromycin (ZITHROMAX) 250 MG tablet, albuterol (PROVENTIL HFA;VENTOLIN HFA) 108 (90 Base) MCG/ACT inhaler, chlorpheniramine-HYDROcodone (TUSSIONEX) 10-8 MG/5ML SUER  Bronchitis: Patient was given a prescription for Z-Pak, and albuterol inhaler, and Tussionex.  She was instructed to obtain an over-the-counter cough syrup for daytime use.  She will proceed with her planned CT scan and will return to see Dr. Julien Nordmann as scheduled.  Please see After Visit Summary for patient specific instructions.  Future Appointments  Date Time Provider [place]  06/08/2017  3:30 PM [facility] 3 [facility] None  06/12/2017 12:00 PM [facility] 2 [facility] [place]  06/15/2017  3:30 PM Curt Bears, MD [facility] None  06/16/2017  9:45 AM [name], MD [facility]  10/16/2017  2:00 PM [facility] 4 [facility] None  10/20/2017  3:15 PM Curt Bears, MD [facility] None    No orders of the defined types were placed in this encounter.     Subjective:   Patient ID:  Regina Black is a 61 y.o. (DOB 1955-09-11) female.  Chief Complaint:  Chief Complaint  Patient presents with  . Cough    HPI Regina Black is a 61 year old female with a diagnosis of a stage Ia (T1a, N0, M0) non-small cell lung cancer, moderately differentiated adenocarcinoma status post wedge resection of the right upper lobe with lymph node sampling.  The patient also had multifocal lung opacities which were suspicious for multifocal low-grade adenocarcinoma.  Her molecular studies were negative for actionable mutations.  She was last seen by Dr. Earlie Server on 04/23/2017.  She is pending a CT of the chest.  She is scheduled to see Dr. Julien Nordmann on  06/15/2008.  She presents to the office today with a cough with thick greenish sputum with black specks which by her report does not appear to be blood and right chest discomfort with coughing.  She reports having shortness of breath since her lung surgery on October.  She denies fevers chills facial pressure facial pain, or postnasal drainage.  A chest x-ray today returned showing no evidence of pneumonia or effusions.  Medications: I have reviewed the patient's current medications.  Allergies:  Allergies  Allergen Reactions  . Other Other (See Comments)    Pt was young adult, had tonsils removed - pt hemorrhaged after the procedure   . Butorphanol Other (See Comments)    Syncope UNABLE TO MOVE LIMBS  . Codeine Nausea And Vomiting    Past Medical History:  Diagnosis Date  . Adenocarcinoma of lung ([place]) 03/26/2017  . Allergy   . Anxiety   . Arrhythmia   . Asthma    as a child  . Blood transfusion without reported diagnosis   . Chicken pox   . Depression   . Diverticulosis 2010   Mild left sided by EGD/Colonoscopy Dr. Alice Reichert  . Eczema   . Endometriosis    not aware of this  . Fatty liver   . GERD (gastroesophageal reflux disease)   . Headache   . Heart murmur   . Hiatal hernia 2010   "small"  . History of cardiovascular stress test 04/2013   negative per OV note from Dr. Marijo File  . History of echocardiogram 04/2013   normal study; EF 75% per Dr. Marijo File  .  History of EKG 2016   NSR with PVC, Dr. Marijo File  . History of Holter monitoring 2014   palpitations/pvc  . Hyperlipidemia   . Hypertension   . Hypokalemia   . Internal hemorrhoid 2010   small  . Osteoarthritis   . PCR DNA positive for HSV1   . PONV (postoperative nausea and vomiting)   . Prediabetes   . PVC (premature ventricular contraction) 2014   palpitations  . Thyroid nodule   . Vertigo   . Vitamin D deficiency     Past Surgical History:  Procedure Laterality Date  . BREAST SURGERY      reduction  . COLONOSCOPY  2010  . ESOPHAGOGASTRODUODENOSCOPY  2010  . EXPLORATORY LAPAROTOMY     endometriosis  . LASIK    . RECTAL SURGERY    . RECTOCELE REPAIR  07/2014  . thyroid nodule asp  01/11/2013  . TONSILLECTOMY AND ADENOIDECTOMY    . TUBAL LIGATION    . VIDEO ASSISTED THORACOSCOPY (VATS)/WEDGE RESECTION Right 03/23/2017   Procedure: VIDEO ASSISTED THORACOSCOPY (VATS)/WEDGE RESECTION;  Surgeon: [name], MD;  Location: [place];  Service: Thoracic;  Laterality: Right;  Marland Kitchen VIDEO BRONCHOSCOPY WITH ENDOBRONCHIAL NAVIGATION N/A 03/05/2016   Procedure: VIDEO BRONCHOSCOPY WITH ENDOBRONCHIAL NAVIGATION;  Surgeon: Collene Gobble, MD;  Location: [facility];  Service: Thoracic;  Laterality: N/A;    Family History  Problem Relation Age of Onset  . Lung cancer Mother   . Heart disease Mother   . Alcohol abuse Father   . Heart disease Father   . Breast cancer Paternal Aunt 44  . Heart disease Maternal Grandmother   . Arthritis Maternal Grandmother   . Arthritis Maternal Grandfather   . Heart disease Maternal Grandfather   . Hearing loss Maternal Grandfather   . Stroke Maternal Grandfather   . Diabetes Paternal Grandmother   . Hearing loss Paternal Grandfather     Social History   Socioeconomic History  . Marital status: Married    Spouse name: Not on file  . Number of children: Not on file  . Years of education: Not on file  . Highest education level: Not on file  Social Needs  . Financial resource strain: Not on file  . Food insecurity - worry: Not on file  . Food insecurity - inability: Not on file  . Transportation needs - medical: Not on file  . Transportation needs - non-medical: Not on file  Occupational History  . Not on file  Tobacco Use  . Smoking status: Former Smoker    Packs/day: 2.00    Years: 25.00    Pack years: 50.00    Types: Cigarettes    Last attempt to quit: 06/09/1998    Years since quitting: 18.9  . Smokeless tobacco: Never Used  Substance  and Sexual Activity  . Alcohol use: Yes    Alcohol/week: 1.2 oz    Types: 2 Glasses of wine per week  . Drug use: No  . Sexual activity: Yes    Birth control/protection: None  Other Topics Concern  . Not on file  Social History Narrative   Married, Shanon Brow. No children.   Mini dachshund (saddie-mae)   HS diploma- Finance.    Former smoker, no etoh or drugs.   Drinks caffeine   Wears seatbelt   Exercises routinely   Smoke detector in the home.    Forearms in the home, locked.    Feels safe in her relationships.     Past Medical  History, Surgical history, Social history, and Family history were reviewed and updated as appropriate.   Please see review of systems for further details on the patient's review from today.   Review of Systems:  Review of Systems  Constitutional: Negative for chills, diaphoresis, fatigue and fever.  HENT: Negative for postnasal drip, rhinorrhea, sinus pressure, sinus pain, trouble swallowing and voice change.   Respiratory: Positive for cough. Negative for chest tightness, shortness of breath and wheezing.   Cardiovascular: Positive for chest pain. Negative for palpitations and leg swelling.  Musculoskeletal: Negative for myalgias.  Neurological: Negative for headaches.    Objective:   Physical Exam:  BP (!) 146/82 (BP Location: Left Arm, Patient Position: Sitting)   Pulse 88   Temp 98.6 F (37 C) (Oral)   Resp 19   Ht 5\' 4"  (1.626 m)   Wt 191 lb 6.4 oz (86.8 kg)   SpO2 96%   BMI 32.85 kg/m  ECOG: 0  Physical Exam  Constitutional: No distress.  HENT:  Head: Normocephalic and atraumatic.  Right Ear: External ear normal.  Left Ear: External ear normal.  Mouth/Throat: Oropharynx is clear and moist. No oropharyngeal exudate.  Eyes: Right eye exhibits no discharge. Left eye exhibits no discharge. No scleral icterus.  Neck: Normal range of motion. Neck supple.  Cardiovascular: Normal rate, regular rhythm and normal heart sounds. Exam reveals  no gallop and no friction rub.  No murmur heard. Pulmonary/Chest: Effort normal and breath sounds normal. No respiratory distress. She has no wheezes. She has no rales.  Musculoskeletal: She exhibits no edema.  Lymphadenopathy:    She has no cervical adenopathy.  Neurological: She is alert. Coordination normal.  Skin: Skin is warm and dry. No rash noted. She is not diaphoretic. No erythema.    Lab Review:     Component Value Date/Time   NA 137 04/23/2017 1351   K 3.5 04/23/2017 1351   CL 105 03/25/2017 0523   CO2 25 04/23/2017 1351   GLUCOSE 98 04/23/2017 1351   BUN 17.3 04/23/2017 1351   CREATININE 0.9 04/23/2017 1351   CALCIUM 9.7 04/23/2017 1351   PROT 8.0 04/23/2017 1351   ALBUMIN 3.9 04/23/2017 1351   AST 25 04/23/2017 1351   ALT 27 04/23/2017 1351   ALKPHOS 123 04/23/2017 1351   BILITOT 0.46 04/23/2017 1351   GFRNONAA >60 03/25/2017 0523   GFRAA >60 03/25/2017 0523       Component Value Date/Time   WBC 8.1 05/28/2017 1459   WBC 9.2 03/25/2017 0523   RBC 4.63 05/28/2017 1459   RBC 3.66 (L) 03/25/2017 0523   HGB 13.9 05/28/2017 1459   HCT 41.3 05/28/2017 1459   PLT 323 05/28/2017 1459   MCV 89.2 05/28/2017 1459   MCH 30.0 05/28/2017 1459   MCH 29.5 03/25/2017 0523   MCHC 33.7 05/28/2017 1459   MCHC 32.0 03/25/2017 0523   RDW 13.4 05/28/2017 1459   LYMPHSABS 2.6 05/28/2017 1459   MONOABS 0.5 05/28/2017 1459   EOSABS 0.9 (H) 05/28/2017 1459   BASOSABS 0.1 05/28/2017 1459   -------------------------------  Imaging from last 24 hours (if applicable):  Radiology interpretation: Dg Chest 1 View  Result Date: 05/28/2017 CLINICAL DATA:  Severe productive cough for 1 week. Status post VATS. EXAM: CHEST 1 VIEW COMPARISON:  04/14/2017. FINDINGS: Continued RIGHT hemidiaphragm elevation. Normal heart size. Surgical staples RIGHT upper lobe, with scarring. No pneumothorax. No effusion. LEFT lung clear. No osseous findings. IMPRESSION: RIGHT pneumothorax has resolved.   No acute  disease. Electronically Signed   By: Staci Righter M.D.   On: 05/28/2017 14:59

## 2017-06-01 ENCOUNTER — Telehealth: Payer: Self-pay | Admitting: Medical

## 2017-06-01 NOTE — Telephone Encounter (Signed)
No 12/20 los.

## 2017-06-08 ENCOUNTER — Other Ambulatory Visit (HOSPITAL_BASED_OUTPATIENT_CLINIC_OR_DEPARTMENT_OTHER): Payer: BLUE CROSS/BLUE SHIELD

## 2017-06-08 DIAGNOSIS — C3490 Malignant neoplasm of unspecified part of unspecified bronchus or lung: Secondary | ICD-10-CM

## 2017-06-08 DIAGNOSIS — C3411 Malignant neoplasm of upper lobe, right bronchus or lung: Secondary | ICD-10-CM

## 2017-06-08 LAB — CBC WITH DIFFERENTIAL/PLATELET
BASO%: 1.1 % (ref 0.0–2.0)
Basophils Absolute: 0.1 10*3/uL (ref 0.0–0.1)
EOS ABS: 0.7 10*3/uL — AB (ref 0.0–0.5)
EOS%: 9.1 % — ABNORMAL HIGH (ref 0.0–7.0)
HCT: 40.1 % (ref 34.8–46.6)
HGB: 13.8 g/dL (ref 11.6–15.9)
LYMPH%: 34 % (ref 14.0–49.7)
MCH: 29.9 pg (ref 25.1–34.0)
MCHC: 34.4 g/dL (ref 31.5–36.0)
MCV: 87.1 fL (ref 79.5–101.0)
MONO#: 0.5 10*3/uL (ref 0.1–0.9)
MONO%: 5.8 % (ref 0.0–14.0)
NEUT#: 4.1 10*3/uL (ref 1.5–6.5)
NEUT%: 50 % (ref 38.4–76.8)
PLATELETS: 357 10*3/uL (ref 145–400)
RBC: 4.6 10*6/uL (ref 3.70–5.45)
RDW: 13.5 % (ref 11.2–14.5)
WBC: 8.2 10*3/uL (ref 3.9–10.3)
lymph#: 2.8 10*3/uL (ref 0.9–3.3)

## 2017-06-08 LAB — COMPREHENSIVE METABOLIC PANEL
ALBUMIN: 4 g/dL (ref 3.5–5.0)
ALK PHOS: 130 U/L (ref 40–150)
ALT: 27 U/L (ref 0–55)
AST: 26 U/L (ref 5–34)
Anion Gap: 10 mEq/L (ref 3–11)
BUN: 18.4 mg/dL (ref 7.0–26.0)
CHLORIDE: 100 meq/L (ref 98–109)
CO2: 27 mEq/L (ref 22–29)
Calcium: 10.1 mg/dL (ref 8.4–10.4)
Creatinine: 0.9 mg/dL (ref 0.6–1.1)
EGFR: >60 mL/min/{1.73_m2} (ref >60)
GLUCOSE: 95 mg/dL (ref 70–140)
POTASSIUM: 3.7 meq/L (ref 3.5–5.1)
SODIUM: 138 meq/L (ref 136–145)
Total Bilirubin: 0.39 mg/dL (ref 0.20–1.20)
Total Protein: 8.1 g/dL (ref 6.4–8.3)

## 2017-06-12 ENCOUNTER — Ambulatory Visit (HOSPITAL_COMMUNITY)
Admission: RE | Admit: 2017-06-12 | Discharge: 2017-06-12 | Disposition: A | Discharge status: Home / Self Care | Payer: BLUE CROSS/BLUE SHIELD | Source: Ambulatory Visit | Attending: Internal Medicine | Admitting: Internal Medicine

## 2017-06-12 DIAGNOSIS — Z9889 Other specified postprocedural states: Secondary | ICD-10-CM | POA: Diagnosis not present

## 2017-06-12 DIAGNOSIS — R918 Other nonspecific abnormal finding of lung field: Secondary | ICD-10-CM | POA: Diagnosis not present

## 2017-06-12 DIAGNOSIS — C3491 Malignant neoplasm of unspecified part of right bronchus or lung: Secondary | ICD-10-CM | POA: Insufficient documentation

## 2017-06-12 MED ORDER — IOPAMIDOL (ISOVUE-300) INJECTION 61%
75.0000 mL | Freq: Once | INTRAVENOUS | Status: AC | PRN
  Administered 2017-06-12: 75 mL via INTRAVENOUS

## 2017-06-15 ENCOUNTER — Other Ambulatory Visit: Payer: Self-pay | Admitting: Thoracic Surgery (Cardiothoracic Vascular Surgery)

## 2017-06-15 ENCOUNTER — Inpatient Hospital Stay: Payer: BLUE CROSS/BLUE SHIELD | Attending: Internal Medicine | Admitting: Internal Medicine

## 2017-06-15 ENCOUNTER — Telehealth: Payer: Self-pay | Admitting: Internal Medicine

## 2017-06-15 ENCOUNTER — Encounter: Payer: Self-pay | Admitting: Internal Medicine

## 2017-06-15 DIAGNOSIS — K219 Gastro-esophageal reflux disease without esophagitis: Secondary | ICD-10-CM | POA: Diagnosis not present

## 2017-06-15 DIAGNOSIS — Z902 Acquired absence of lung [part of]: Secondary | ICD-10-CM

## 2017-06-15 DIAGNOSIS — R05 Cough: Secondary | ICD-10-CM | POA: Diagnosis not present

## 2017-06-15 DIAGNOSIS — R091 Pleurisy: Secondary | ICD-10-CM | POA: Diagnosis not present

## 2017-06-15 DIAGNOSIS — G8918 Other acute postprocedural pain: Secondary | ICD-10-CM | POA: Diagnosis not present

## 2017-06-15 DIAGNOSIS — K760 Fatty (change of) liver, not elsewhere classified: Secondary | ICD-10-CM | POA: Diagnosis not present

## 2017-06-15 DIAGNOSIS — M1990 Unspecified osteoarthritis, unspecified site: Secondary | ICD-10-CM | POA: Insufficient documentation

## 2017-06-15 DIAGNOSIS — R5383 Other fatigue: Secondary | ICD-10-CM | POA: Insufficient documentation

## 2017-06-15 DIAGNOSIS — F418 Other specified anxiety disorders: Secondary | ICD-10-CM | POA: Insufficient documentation

## 2017-06-15 DIAGNOSIS — Z79899 Other long term (current) drug therapy: Secondary | ICD-10-CM | POA: Diagnosis not present

## 2017-06-15 DIAGNOSIS — J45909 Unspecified asthma, uncomplicated: Secondary | ICD-10-CM | POA: Diagnosis not present

## 2017-06-15 DIAGNOSIS — C3411 Malignant neoplasm of upper lobe, right bronchus or lung: Secondary | ICD-10-CM | POA: Diagnosis not present

## 2017-06-15 DIAGNOSIS — C3490 Malignant neoplasm of unspecified part of unspecified bronchus or lung: Secondary | ICD-10-CM

## 2017-06-15 DIAGNOSIS — R0789 Other chest pain: Secondary | ICD-10-CM | POA: Diagnosis not present

## 2017-06-15 NOTE — Telephone Encounter (Signed)
Gave avs and calendar for july °

## 2017-06-15 NOTE — Progress Notes (Signed)
[facility] [phone]) [phone]   [phone]) [phone]  OFFICE PROGRESS NOTE  [name], DO [address] [place] [address]  DIAGNOSIS: stage IA (T1a, N0, M0) non-small cell lung cancer, moderately differentiated adenocarcinoma.  The patient also has multifocal lung opacities suspicious for multifocal low-grade adenocarcinoma.  Her molecular studies were negative for actionable mutations.  PRIOR THERAPY:  status post wedge resection of the right upper lobe with lymph node sampling on March 23, 2017 under the care of Dr. Roxan Hockey.  CURRENT THERAPY: Observation.  INTERVAL HISTORY: Regina Black 62 y.o. female returns to the clinic today for follow-up visit accompanied by her husband and daughter.  The patient is feeling fine today with no specific complaints except for intermittent right-sided chest pain at the surgical scar site.  She denied having any shortness of breath, cough or hemoptysis.  She has no weight loss or night sweats.  She has no nausea, vomiting, diarrhea or constipation.  She had repeat CT scan of the chest performed recently and she is here for evaluation and discussion of her scan results.  MEDICAL HISTORY: Past Medical History:  Diagnosis Date  . Adenocarcinoma of lung ([place]) 03/26/2017  . Allergy   . Anxiety   . Arrhythmia   . Asthma    as a child  . Blood transfusion without reported diagnosis   . Chicken pox   . Depression   . Diverticulosis 2010   Mild left sided by EGD/Colonoscopy Dr. Alice Reichert  . Eczema   . Endometriosis    not aware of this  . Fatty liver   . GERD (gastroesophageal reflux disease)   . Headache   . Heart murmur   . Hiatal hernia 2010   "small"  . History of cardiovascular stress test 04/2013   negative per OV note from Dr. Marijo File  . History of echocardiogram 04/2013   normal study; EF 75% per Dr. Marijo File  . History of EKG 2016   NSR with PVC, Dr. Marijo File  . History of Holter  monitoring 2014   palpitations/pvc  . Hyperlipidemia   . Hypertension   . Hypokalemia   . Internal hemorrhoid 2010   small  . Osteoarthritis   . PCR DNA positive for HSV1   . PONV (postoperative nausea and vomiting)   . Prediabetes   . PVC (premature ventricular contraction) 2014   palpitations  . Thyroid nodule   . Vertigo   . Vitamin D deficiency     ALLERGIES:  is allergic to other; butorphanol; and codeine.  MEDICATIONS:  Current Outpatient Medications  Medication Sig Dispense Refill  . acetaminophen (TYLENOL) 500 MG tablet Take 2 tablets (1,000 mg total) by mouth every 6 (six) hours as needed. (Patient not taking: Reported on 04/23/2017) 30 tablet 0  . albuterol (PROVENTIL HFA;VENTOLIN HFA) 108 (90 Base) MCG/ACT inhaler Inhale 2 puffs into the lungs every 6 (six) hours as needed for wheezing or shortness of breath. 1 Inhaler 1  . azithromycin (ZITHROMAX) 250 MG tablet 2 tablets day #1, then 1 tablet on days 2 through 5 6 each 0  . chlorpheniramine-HYDROcodone (TUSSIONEX) 10-8 MG/5ML SUER Take 5 mLs by mouth every 12 (twelve) hours as needed for cough. 140 mL 0  . diphenhydrAMINE (BENADRYL) 25 MG tablet Take 50 mg at bedtime as needed by mouth.    . DULoxetine (CYMBALTA) 60 MG capsule Take 1 capsule (60 mg total) by mouth daily. Needs office visit prior to anymore refills  90 capsule 1  . fluticasone (FLONASE) 50 MCG/ACT nasal spray Place 2 sprays into both nostrils daily as needed (allergies/congestion).    . hydrochlorothiazide (HYDRODIURIL) 25 MG tablet Take 25 mg by mouth daily.    Marland Kitchen ibuprofen (ADVIL,MOTRIN) 800 MG tablet Take 800 mg by mouth.    Marland Kitchen lisinopril (PRINIVIL,ZESTRIL) 40 MG tablet Take 40 mg by mouth daily.    Marland Kitchen loratadine (CLARITIN) 10 MG tablet Take 10 mg by mouth daily.    . metoprolol succinate (TOPROL-XL) 25 MG 24 hr tablet Take 3 tablets (75 mg total) by mouth daily. Take with or immediately following a meal. (Patient taking differently: Take 50 mg by mouth  daily. Take with or immediately following a meal.) 90 tablet 0  . nystatin cream (MYCOSTATIN) Apply 1 application topically 2 (two) times daily. (Patient not taking: Reported on 04/23/2017) 30 g 1  . Olopatadine HCl 0.2 % SOLN Apply 1 drop to eye daily. 1 drop each eye daily (Patient taking differently: Place 1 drop into both eyes daily as needed (eye allergies). 1 drop each eye daily) 1 Bottle 0  . omeprazole (PRILOSEC) 20 MG capsule Take 20 mg by mouth daily.     Marland Kitchen penciclovir (DENAVIR) 1 % cream Apply 1 application topically every 4 (four) hours as needed (applied to lip area for recurrent cold sores).     . polyethylene glycol (MIRALAX / GLYCOLAX) packet Take 17 g daily by mouth.    . valACYclovir (VALTREX) 1000 MG tablet Take 1 g by mouth 2 (two) times daily as needed. For fever blisters/cold sores.  0   No current facility-administered medications for this visit.     SURGICAL HISTORY:  Past Surgical History:  Procedure Laterality Date  . BREAST SURGERY     reduction  . COLONOSCOPY  2010  . ESOPHAGOGASTRODUODENOSCOPY  2010  . EXPLORATORY LAPAROTOMY     endometriosis  . LASIK    . RECTAL SURGERY    . RECTOCELE REPAIR  07/2014  . thyroid nodule asp  01/11/2013  . TONSILLECTOMY AND ADENOIDECTOMY    . TUBAL LIGATION    . VIDEO ASSISTED THORACOSCOPY (VATS)/WEDGE RESECTION Right 03/23/2017   Procedure: VIDEO ASSISTED THORACOSCOPY (VATS)/WEDGE RESECTION;  Surgeon: [name], MD;  Location: [facility];  Service: Thoracic;  Laterality: Right;  Marland Kitchen VIDEO BRONCHOSCOPY WITH ENDOBRONCHIAL NAVIGATION N/A 03/05/2016   Procedure: VIDEO BRONCHOSCOPY WITH ENDOBRONCHIAL NAVIGATION;  Surgeon: Collene Gobble, MD;  Location: [facility];  Service: Thoracic;  Laterality: N/A;    REVIEW OF SYSTEMS:  A comprehensive review of systems was negative except for: Respiratory: positive for pleurisy/chest pain   PHYSICAL EXAMINATION: General appearance: alert, cooperative, fatigued and no distress Head:  Normocephalic, without obvious abnormality, atraumatic Neck: no adenopathy, no JVD, supple, symmetrical, trachea midline and thyroid not enlarged, symmetric, no tenderness/mass/nodules Lymph nodes: Cervical, supraclavicular, and axillary nodes normal. Resp: clear to auscultation bilaterally Back: symmetric, no curvature. ROM normal. No CVA tenderness. Cardio: regular rate and rhythm, S1, S2 normal, no murmur, click, rub or gallop GI: soft, non-tender; bowel sounds normal; no masses,  no organomegaly Extremities: extremities normal, atraumatic, no cyanosis or edema  ECOG PERFORMANCE STATUS: 1 - Symptomatic but completely ambulatory  Blood pressure (!) 154/92, pulse 87, temperature 98.2 F (36.8 C), temperature source Oral, resp. rate 18, height 5\' 4"  (1.626 m), weight 192 lb 3.2 oz (87.2 kg), SpO2 96 %.  LABORATORY DATA: Lab Results  Component Value Date   WBC 8.2 06/08/2017   HGB 13.8  06/08/2017   HCT 40.1 06/08/2017   MCV 87.1 06/08/2017   PLT 357 06/08/2017      Chemistry      Component Value Date/Time   NA 138 06/08/2017 1525   K 3.7 06/08/2017 1525   CL 105 03/25/2017 0523   CO2 27 06/08/2017 1525   BUN 18.4 06/08/2017 1525   CREATININE 0.9 06/08/2017 1525   GLU 104 09/27/2016      Component Value Date/Time   CALCIUM 10.1 06/08/2017 1525   ALKPHOS 130 06/08/2017 1525   AST 26 06/08/2017 1525   ALT 27 06/08/2017 1525   BILITOT 0.39 06/08/2017 1525       RADIOGRAPHIC STUDIES: Dg Chest 1 View  Result Date: 05/28/2017 CLINICAL DATA:  Severe productive cough for 1 week. Status post VATS. EXAM: CHEST 1 VIEW COMPARISON:  04/14/2017. FINDINGS: Continued RIGHT hemidiaphragm elevation. Normal heart size. Surgical staples RIGHT upper lobe, with scarring. No pneumothorax. No effusion. LEFT lung clear. No osseous findings. IMPRESSION: RIGHT pneumothorax has resolved.  No acute disease. Electronically Signed   By: Staci Righter M.D.   On: 05/28/2017 14:59   Ct Chest W  Contrast  Result Date: 06/12/2017 CLINICAL DATA:  Evaluate for interval growth of lung nodules. Patient underwent right upper lobe wedge resection on 03/23/2017. This represents the first postoperative follow-up study. EXAM: CT CHEST WITH CONTRAST TECHNIQUE: Multidetector CT imaging of the chest was performed during intravenous contrast administration. CONTRAST:  63mL ISOVUE-300 IOPAMIDOL (ISOVUE-300) INJECTION 61% COMPARISON:  02/17/2017 FINDINGS: Cardiovascular: The heart size is normal. No pericardial effusion. No thoracic aortic aneurysm. Mediastinum/Nodes: No mediastinal lymphadenopathy. There is no hilar lymphadenopathy. The esophagus has normal imaging features. Similar appearance of left thyroid nodule. Lungs/Pleura: Scarring is identified in the right upper lobe. There is some soft tissue thickening along the suture line which is probably scarring related but attention on follow-up recommended. Dominant 14 mm lesion seen on the previous study has apparently been resected. The smaller more posterior 8 mm right upper lobe pulmonary lesion is not definitely seen but may be incorporated into the architectural distortion/scarring if it was not resected. There is some atelectasis along the inferior aspect of the right major fissure. 10 mm ground-glass nodule posterior left upper lobe is similar at 10 mm today (image 57 series 7). There is another 14 mm ground-glass nodule in the left upper lobe seen on image 43 and a third subpleural ground-glass nodule in the left upper lobe is visible on image 36, measuring 7 mm and stable in the interval. No pulmonary edema or pleural effusion. Upper Abdomen: Unremarkable. Musculoskeletal: Bone windows reveal no worrisome lytic or sclerotic osseous lesions. IMPRESSION: 1. Interval right upper lobe wedge resection with architectural distortion and scarring. 2. Remaining ground-glass nodule seen in the left lung on today's study are stable in the interval since previous exam.  Electronically Signed   By: Misty Stanley M.D.   On: 06/12/2017 14:27    ASSESSMENT AND PLAN: This is a very pleasant 62 years old white female with a stage IA non-small cell lung cancer, moderately differentiated adenocarcinoma status post right upper lobectomy with lymph node dissection in October 2018. The patient is currently on observation and she is feeling fine except for the intermittent pain on the right side of the chest at the surgical scar. She had repeat CT scan of the chest performed recently.  I personally and independently reviewed the scan images and discussed the results with the patient and her family.  The scan  showed no concerning findings for disease progression but there was stable groundglass opacities in the left lung. I recommended for the patient to continue on observation with repeat CT scan of the chest in 6 months. For the intermittent right-sided chest pain from the surgical scar, she was advised to take Tylenol or ibuprofen as needed. The patient was advised to call immediately if she has any concerning symptoms in the interval. The patient voices understanding of current disease status and treatment options and is in agreement with the current care plan.  All questions were answered. The patient knows to call the clinic with any problems, questions or concerns. We can certainly see the patient much sooner if necessary.  I spent 10 minutes counseling the patient face to face. The total time spent in the appointment was 15 minutes.  Disclaimer: This note was dictated with voice recognition software. Similar sounding words can inadvertently be transcribed and may not be corrected upon review.

## 2017-06-16 ENCOUNTER — Ambulatory Visit
Admission: RE | Admit: 2017-06-16 | Discharge: 2017-06-16 | Disposition: A | Discharge status: Home / Self Care | Payer: BLUE CROSS/BLUE SHIELD | Source: Ambulatory Visit | Attending: Thoracic Surgery (Cardiothoracic Vascular Surgery) | Admitting: Thoracic Surgery (Cardiothoracic Vascular Surgery)

## 2017-06-16 ENCOUNTER — Ambulatory Visit (INDEPENDENT_AMBULATORY_CARE_PROVIDER_SITE_OTHER): Payer: Self-pay | Admitting: Thoracic Surgery (Cardiothoracic Vascular Surgery)

## 2017-06-16 ENCOUNTER — Encounter: Payer: Self-pay | Admitting: Thoracic Surgery (Cardiothoracic Vascular Surgery)

## 2017-06-16 ENCOUNTER — Other Ambulatory Visit: Payer: Self-pay

## 2017-06-16 VITALS — BP 168/97 | HR 76 | Resp 18 | Ht 64.0 in | Wt 188.8 lb

## 2017-06-16 DIAGNOSIS — C3491 Malignant neoplasm of unspecified part of right bronchus or lung: Secondary | ICD-10-CM | POA: Diagnosis not present

## 2017-06-16 DIAGNOSIS — C3490 Malignant neoplasm of unspecified part of unspecified bronchus or lung: Secondary | ICD-10-CM

## 2017-06-16 MED ORDER — PREGABALIN 25 MG PO CAPS: 25.0000 mg | ORAL_CAPSULE | Freq: Two times a day (BID) | ORAL | 2 refills | Status: DC

## 2017-06-16 NOTE — Progress Notes (Signed)
[address]       ,[place] [id number]             [phone]     HPI: Regina Black returns for a scheduled postop follow up visit  She is a 62 year old woman with a remote history of tobacco abuse who had multiple groundglass lung nodules bilaterally.  Navigational bronchoscopy was nondiagnostic.  I did a right VATS with wedge resection and lymph node sampling on 03/23/2017.  The nodule was an adenocarcinoma.  Molecular testing did not show an actionable mutation.  She did well postop and went home on day 3.  She saw Dr. Julien Nordmann yesterday.  No additional therapy was recommended at this time.  She will continue with observation.  She is continued to have pain under the right breast. It feels like it starts anteriorly and migrates posteriorly.  She still has a little shortness of breath but that is improving.  Past Medical History:  Diagnosis Date  . Adenocarcinoma of lung ([place]) 03/26/2017  . Allergy   . Anxiety   . Arrhythmia   . Asthma    as a child  . Blood transfusion without reported diagnosis   . Chicken pox   . Depression   . Diverticulosis 2010   Mild left sided by EGD/Colonoscopy Dr. Alice Reichert  . Eczema   . Endometriosis    not aware of this  . Fatty liver   . GERD (gastroesophageal reflux disease)   . Headache   . Heart murmur   . Hiatal hernia 2010   "small"  . History of cardiovascular stress test 04/2013   negative per OV note from Dr. Marijo File  . History of echocardiogram 04/2013   normal study; EF 75% per Dr. Marijo File  . History of EKG 2016   NSR with PVC, Dr. Marijo File  . History of Holter monitoring 2014   palpitations/pvc  . Hyperlipidemia   . Hypertension   . Hypokalemia   . Internal hemorrhoid 2010   small  . Osteoarthritis   . PCR DNA positive for HSV1   . PONV (postoperative nausea and vomiting)   . Prediabetes   . PVC (premature ventricular contraction) 2014   palpitations  . Thyroid nodule   . Vertigo   . Vitamin D  deficiency      Current Outpatient Medications  Medication Sig Dispense Refill  . acetaminophen (TYLENOL) 500 MG tablet Take 2 tablets (1,000 mg total) by mouth every 6 (six) hours as needed. 30 tablet 0  . albuterol (PROVENTIL HFA;VENTOLIN HFA) 108 (90 Base) MCG/ACT inhaler Inhale 2 puffs into the lungs every 6 (six) hours as needed for wheezing or shortness of breath. 1 Inhaler 1  . azithromycin (ZITHROMAX) 250 MG tablet 2 tablets day #1, then 1 tablet on days 2 through 5 6 each 0  . chlorpheniramine-HYDROcodone (TUSSIONEX) 10-8 MG/5ML SUER Take 5 mLs by mouth every 12 (twelve) hours as needed for cough. 140 mL 0  . diphenhydrAMINE (BENADRYL) 25 MG tablet Take 50 mg at bedtime as needed by mouth.    . DULoxetine (CYMBALTA) 60 MG capsule Take 1 capsule (60 mg total) by mouth daily. Needs office visit prior to anymore refills 90 capsule 1  . fluticasone (FLONASE) 50 MCG/ACT nasal spray Place 2 sprays into both nostrils daily as needed (allergies/congestion).    . hydrochlorothiazide (HYDRODIURIL) 25 MG tablet Take 25 mg by mouth daily.    Marland Kitchen ibuprofen (ADVIL,MOTRIN) 800 MG tablet Take 800 mg  by mouth.    Marland Kitchen lisinopril (PRINIVIL,ZESTRIL) 40 MG tablet Take 40 mg by mouth daily.    Marland Kitchen loratadine (CLARITIN) 10 MG tablet Take 10 mg by mouth daily.    . metoprolol succinate (TOPROL-XL) 25 MG 24 hr tablet Take 3 tablets (75 mg total) by mouth daily. Take with or immediately following a meal. (Patient taking differently: Take 50 mg by mouth daily. Take with or immediately following a meal.) 90 tablet 0  . nystatin cream (MYCOSTATIN) Apply 1 application topically 2 (two) times daily. 30 g 1  . Olopatadine HCl 0.2 % SOLN Apply 1 drop to eye daily. 1 drop each eye daily (Patient taking differently: Place 1 drop into both eyes daily as needed (eye allergies). 1 drop each eye daily) 1 Bottle 0  . omeprazole (PRILOSEC) 20 MG capsule Take 20 mg by mouth daily.     Marland Kitchen penciclovir (DENAVIR) 1 % cream Apply 1  application topically every 4 (four) hours as needed (applied to lip area for recurrent cold sores).     . polyethylene glycol (MIRALAX / GLYCOLAX) packet Take 17 g daily by mouth.    . valACYclovir (VALTREX) 1000 MG tablet Take 1 g by mouth 2 (two) times daily as needed. For fever blisters/cold sores.  0  . pregabalin (LYRICA) 25 MG capsule Take 1 capsule (25 mg total) by mouth 2 (two) times daily. 60 capsule 2   No current facility-administered medications for this visit.     Physical Exam BP (!) 168/97 (BP Location: Left Arm, Patient Position: Sitting, Cuff Size: Normal)   Pulse 76   Resp 18   Ht 5\' 4"  (1.626 m)   Wt 188 lb 12.8 oz (85.6 kg)   SpO2 98% Comment: RA  BMI 32.34 kg/m  62 year old woman in no acute distress Alert and oriented x3 with no focal deficits Lungs diminished at right base, otherwise clear Incisions well-healed  Diagnostic Tests: CT CHEST WITH CONTRAST  TECHNIQUE: Multidetector CT imaging of the chest was performed during intravenous contrast administration.  CONTRAST:  13mL ISOVUE-300 IOPAMIDOL (ISOVUE-300) INJECTION 61%  COMPARISON:  02/17/2017  FINDINGS: Cardiovascular: The heart size is normal. No pericardial effusion. No thoracic aortic aneurysm.  Mediastinum/Nodes: No mediastinal lymphadenopathy. There is no hilar lymphadenopathy. The esophagus has normal imaging features. Similar appearance of left thyroid nodule.  Lungs/Pleura: Scarring is identified in the right upper lobe. There is some soft tissue thickening along the suture line which is probably scarring related but attention on follow-up recommended. Dominant 14 mm lesion seen on the previous study has apparently been resected. The smaller more posterior 8 mm right upper lobe pulmonary lesion is not definitely seen but may be incorporated into the architectural distortion/scarring if it was not resected. There is some atelectasis along the inferior aspect of the right  major fissure. 10 mm ground-glass nodule posterior left upper lobe is similar at 10 mm today (image 57 series 7). There is another 14 mm ground-glass nodule in the left upper lobe seen on image 43 and a third subpleural ground-glass nodule in the left upper lobe is visible on image 36, measuring 7 mm and stable in the interval. No pulmonary edema or pleural effusion.  Upper Abdomen: Unremarkable.  Musculoskeletal: Bone windows reveal no worrisome lytic or sclerotic osseous lesions.  IMPRESSION: 1. Interval right upper lobe wedge resection with architectural distortion and scarring. 2. Remaining ground-glass nodule seen in the left lung on today's study are stable in the interval since previous exam.  Electronically Signed   By: Misty Stanley M.D.   On: 06/12/2017 14:27 CHEST  2 VIEW  COMPARISON:  05/28/2017.  CT 06/12/2017.  FINDINGS: Postoperative changes on the right. Stable elevation of the right hemidiaphragm. Lungs are clear. Heart is normal size. No effusions or acute bony abnormality.  IMPRESSION: Postoperative changes on the right.  No active disease.   Electronically Signed   By: Rolm Baptise M.D.   On: 06/16/2017 09:34 I personally reviewed the CT and chest x-ray images and concur with the findings noted above  Impression: Regina Black is a 62 year old woman with multiple bilateral groundglass opacities of the lung.  Wedge resection showed adenocarcinoma.  There was no lymph node involvement.  No adjuvant therapy is planned and she currently is on radiographic observation.  If she continues to have pain related to her surgery.  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  Narcotics are generally ineffective for treatment of that.  I offered her a trial of Lyrica and she is interested in trying that.  We will start with a low dose of 25 mg twice daily.  I did caution her that this relief takes time and is not an immediate  effect.  Hypertension-blood pressure elevated today.  She has a cuff at home I recommended that she follow that over the next couple of weeks if it remains persistently elevated she should contact her primary  Plan: Return in 6 months after CT  [name], MD [facility] [phone]

## 2017-06-17 ENCOUNTER — Encounter: Payer: Self-pay | Admitting: *Deleted

## 2017-06-17 DIAGNOSIS — C3491 Malignant neoplasm of unspecified part of right bronchus or lung: Secondary | ICD-10-CM

## 2017-06-17 NOTE — Progress Notes (Signed)
Oncology Nurse Navigator Documentation  Oncology Nurse Navigator Flowsheets 06/17/2017  Navigator Location [facility]  Navigator Encounter Type Telephone;Other/Ms. Eckles called me and I called her back. She would like a second opinion. I updated Dr. Julien Nordmann and he was ok with decision. I updated her on next steps and made referral to [facility]. I will update HIM dept to help expedite referral.   Telephone Outgoing Call;Incoming Call  Treatment Phase Follow-up  Barriers/Navigation Needs Coordination of Care;Education  Education Other  Interventions Education;Coordination of Care  Coordination of Care Other  Education Method Verbal  Acuity Level 2  Acuity Level 2 Referrals such as genetics, survivorship;Educational needs  Time Spent with Patient 76

## 2017-06-19 ENCOUNTER — Telehealth: Payer: Self-pay | Admitting: Medical Oncology

## 2017-06-19 ENCOUNTER — Telehealth: Payer: Self-pay | Admitting: Internal Medicine

## 2017-06-19 NOTE — Telephone Encounter (Signed)
Called Dr Stinchcomb's office left vm in ref. To new pt appt.

## 2017-06-19 NOTE — Telephone Encounter (Signed)
I told pt that referral was made to Dr Gwenlyn Saran on 06/17/17

## 2017-06-25 ENCOUNTER — Telehealth: Payer: Self-pay | Admitting: *Deleted

## 2017-06-25 ENCOUNTER — Other Ambulatory Visit: Payer: Self-pay | Admitting: Medical Oncology

## 2017-06-25 ENCOUNTER — Telehealth: Payer: Self-pay | Admitting: Internal Medicine

## 2017-06-25 NOTE — Telephone Encounter (Signed)
Oncology Nurse Navigator Documentation  Oncology Nurse Navigator Flowsheets 06/25/2017  Navigator Location [facility]  Navigator Encounter Type Telephone/I received a message that patient has not heard from [facility] regarding referral.  I contacted Maudie Mercury in [employer]. She states she called [facility] and re-faxed referral.  I called Ms. Estelle and updated.   Telephone Outgoing Call  Treatment Phase Follow-up  Barriers/Navigation Needs Coordination of Care;Education  Education Other  Interventions Coordination of Care;Education  Coordination of Care Other  Education Method Verbal  Acuity Level 2  Time Spent with Patient 30

## 2017-06-25 NOTE — Telephone Encounter (Signed)
FAXED PT RECORDS TO  DR Aniceto Boss. NEW PT SCHEDULER WILL CALL PT WITH APPT.

## 2017-07-01 DIAGNOSIS — C3411 Malignant neoplasm of upper lobe, right bronchus or lung: Secondary | ICD-10-CM | POA: Diagnosis not present

## 2017-07-01 DIAGNOSIS — J439 Emphysema, unspecified: Secondary | ICD-10-CM | POA: Diagnosis not present

## 2017-07-01 DIAGNOSIS — C801 Malignant (primary) neoplasm, unspecified: Secondary | ICD-10-CM | POA: Diagnosis not present

## 2017-07-01 DIAGNOSIS — Z136 Encounter for screening for cardiovascular disorders: Secondary | ICD-10-CM | POA: Diagnosis not present

## 2017-07-02 DIAGNOSIS — R110 Nausea: Secondary | ICD-10-CM | POA: Diagnosis not present

## 2017-07-02 DIAGNOSIS — I10 Essential (primary) hypertension: Secondary | ICD-10-CM | POA: Diagnosis not present

## 2017-07-03 ENCOUNTER — Telehealth: Payer: Self-pay | Admitting: *Deleted

## 2017-07-03 DIAGNOSIS — C3491 Malignant neoplasm of unspecified part of right bronchus or lung: Secondary | ICD-10-CM | POA: Diagnosis not present

## 2017-07-03 DIAGNOSIS — R911 Solitary pulmonary nodule: Secondary | ICD-10-CM | POA: Diagnosis not present

## 2017-07-03 DIAGNOSIS — C3490 Malignant neoplasm of unspecified part of unspecified bronchus or lung: Secondary | ICD-10-CM | POA: Diagnosis not present

## 2017-07-03 NOTE — Telephone Encounter (Signed)
Oncology Nurse Navigator Documentation  Oncology Nurse Navigator Flowsheets 07/03/2017  Navigator Location [facility]  Navigator Encounter Type Telephone/I called to follow up with Regina Black.  I wanted to make sure she had an appt at [facility].  She states her is on her way today to be seen at [facility].    Telephone Outgoing Call  Treatment Phase Follow-up  Interventions Other  Acuity Level 1  Time Spent with Patient 15

## 2017-07-06 ENCOUNTER — Telehealth: Payer: Self-pay | Admitting: *Deleted

## 2017-07-06 NOTE — Telephone Encounter (Signed)
Oncology Nurse Navigator Documentation  Oncology Nurse Navigator Flowsheets 07/06/2017  Navigator Location [facility]  Navigator Encounter Type Telephone/Ms. Sedam called and I called her back.  She had questions about follow up.  I clarified concerns and will update Dr. Julien Nordmann  Telephone Incoming Call;Outgoing Call  Treatment Phase Follow-up  Barriers/Navigation Needs Education;Coordination of Care  Education Other  Interventions Coordination of Care;Education  Coordination of Care Other  Education Method Verbal  Acuity Level 2  Time Spent with Patient 30

## 2017-07-06 NOTE — Telephone Encounter (Signed)
Oncology Nurse Navigator Documentation  Oncology Nurse Navigator Flowsheets 07/06/2017  Navigator Location [facility]  Navigator Encounter Type Telephone/I spoke with Dr. Julien Nordmann regarding patients request for 3 month follow up. He states ok with him. I called patient to update her. She states she will speak with her husband and call me tomorrow with an update.   Telephone Outgoing Call  Treatment Phase Follow-up  Barriers/Navigation Needs Education  Education Other  Interventions Education  Education Method Verbal  Acuity Level 2  Time Spent with Patient 30

## 2017-07-07 ENCOUNTER — Encounter: Payer: Self-pay | Admitting: *Deleted

## 2017-07-07 ENCOUNTER — Telehealth: Payer: Self-pay | Admitting: *Deleted

## 2017-07-07 DIAGNOSIS — C3491 Malignant neoplasm of unspecified part of right bronchus or lung: Secondary | ICD-10-CM

## 2017-07-07 NOTE — Telephone Encounter (Signed)
Oncology Nurse Navigator Documentation  Oncology Nurse Navigator Flowsheets 07/07/2017  Navigator Location [facility]  Navigator Encounter Type Telephone/I received message from patient that she would like 3 opinion. Referral completed, I updated HIM dept on referral. I called patient to update on referral has been made. Patient was thankful for the help.   Telephone Outgoing Call;Incoming Call  Treatment Phase Follow-up  Barriers/Navigation Needs Coordination of Care;Education  Education Other  Interventions Education;Coordination of Care  Coordination of Care Other  Education Method Verbal  Acuity Level 2  Time Spent with Patient 30

## 2017-07-08 ENCOUNTER — Telehealth: Payer: Self-pay | Admitting: Internal Medicine

## 2017-07-08 NOTE — Telephone Encounter (Signed)
Pt appt with Dr. Genene Churn @ [facility] is 07/15/17 @3 :00. Slides and scans fedex'ed. [facility] will call pt with appt.

## 2017-07-13 ENCOUNTER — Telehealth: Payer: Self-pay | Admitting: *Deleted

## 2017-07-13 NOTE — Telephone Encounter (Signed)
Oncology Nurse Navigator Documentation  Oncology Nurse Navigator Flowsheets 07/13/2017  Navigator Location [facility]  Navigator Encounter Type Telephone/I called HIM dept to check on referral to [facility].  All records have been sent.  I called patient to update. She states she has already been notified of an appt on 07/15/17.    Telephone Outgoing Call  Treatment Phase Follow-up  Barriers/Navigation Needs Education  Education Other  Interventions Education  Education Method Verbal  Acuity Level 2  Time Spent with Patient 30

## 2017-07-15 DIAGNOSIS — C3491 Malignant neoplasm of unspecified part of right bronchus or lung: Secondary | ICD-10-CM | POA: Diagnosis not present

## 2017-07-23 DIAGNOSIS — K219 Gastro-esophageal reflux disease without esophagitis: Secondary | ICD-10-CM | POA: Diagnosis not present

## 2017-07-23 DIAGNOSIS — M129 Arthropathy, unspecified: Secondary | ICD-10-CM | POA: Diagnosis not present

## 2017-07-23 DIAGNOSIS — J3089 Other allergic rhinitis: Secondary | ICD-10-CM | POA: Diagnosis not present

## 2017-07-23 DIAGNOSIS — I10 Essential (primary) hypertension: Secondary | ICD-10-CM | POA: Diagnosis not present

## 2017-08-05 DIAGNOSIS — C3411 Malignant neoplasm of upper lobe, right bronchus or lung: Secondary | ICD-10-CM | POA: Diagnosis not present

## 2017-09-01 DIAGNOSIS — R918 Other nonspecific abnormal finding of lung field: Secondary | ICD-10-CM | POA: Diagnosis not present

## 2017-09-01 DIAGNOSIS — C3491 Malignant neoplasm of unspecified part of right bronchus or lung: Secondary | ICD-10-CM | POA: Diagnosis not present

## 2017-09-09 DIAGNOSIS — L559 Sunburn, unspecified: Secondary | ICD-10-CM | POA: Diagnosis not present

## 2017-09-10 DIAGNOSIS — Z87891 Personal history of nicotine dependence: Secondary | ICD-10-CM | POA: Diagnosis not present

## 2017-09-10 DIAGNOSIS — C3491 Malignant neoplasm of unspecified part of right bronchus or lung: Secondary | ICD-10-CM | POA: Diagnosis not present

## 2017-09-30 DIAGNOSIS — Z0000 Encounter for general adult medical examination without abnormal findings: Secondary | ICD-10-CM | POA: Diagnosis not present

## 2017-10-13 ENCOUNTER — Encounter: Payer: Self-pay | Admitting: Family Medicine

## 2017-10-13 ENCOUNTER — Telehealth: Payer: Self-pay | Admitting: *Deleted

## 2017-10-13 ENCOUNTER — Ambulatory Visit: Payer: BLUE CROSS/BLUE SHIELD | Admitting: Family Medicine

## 2017-10-13 ENCOUNTER — Telehealth: Payer: Self-pay | Admitting: Family Medicine

## 2017-10-13 VITALS — BP 130/85 | HR 58 | Temp 98.0°F | Resp 20 | Ht 64.0 in | Wt 187.0 lb

## 2017-10-13 DIAGNOSIS — Z131 Encounter for screening for diabetes mellitus: Secondary | ICD-10-CM | POA: Diagnosis not present

## 2017-10-13 DIAGNOSIS — R5383 Other fatigue: Secondary | ICD-10-CM

## 2017-10-13 DIAGNOSIS — F418 Other specified anxiety disorders: Secondary | ICD-10-CM

## 2017-10-13 DIAGNOSIS — I499 Cardiac arrhythmia, unspecified: Secondary | ICD-10-CM

## 2017-10-13 DIAGNOSIS — C3491 Malignant neoplasm of unspecified part of right bronchus or lung: Secondary | ICD-10-CM

## 2017-10-13 DIAGNOSIS — E041 Nontoxic single thyroid nodule: Secondary | ICD-10-CM | POA: Diagnosis not present

## 2017-10-13 DIAGNOSIS — Z0000 Encounter for general adult medical examination without abnormal findings: Secondary | ICD-10-CM | POA: Diagnosis not present

## 2017-10-13 DIAGNOSIS — I10 Essential (primary) hypertension: Secondary | ICD-10-CM | POA: Diagnosis not present

## 2017-10-13 DIAGNOSIS — R7989 Other specified abnormal findings of blood chemistry: Secondary | ICD-10-CM

## 2017-10-13 DIAGNOSIS — E559 Vitamin D deficiency, unspecified: Secondary | ICD-10-CM

## 2017-10-13 DIAGNOSIS — E785 Hyperlipidemia, unspecified: Secondary | ICD-10-CM

## 2017-10-13 MED ORDER — DULOXETINE HCL 60 MG PO CPEP
60.0000 mg | ORAL_CAPSULE | Freq: Every day | ORAL | 1 refills | Status: AC
Start: 2017-10-13 — End: ?

## 2017-10-13 MED ORDER — METOPROLOL SUCCINATE ER 50 MG PO TB24: 50.0000 mg | ORAL_TABLET | Freq: Every day | ORAL | 1 refills | Status: AC

## 2017-10-13 NOTE — Progress Notes (Signed)
Patient ID: Regina Black, female  DOB: September 01, 1955, 62 y.o.   MRN: [id number] Patient Care Team    Relationship Specialty Notifications Start End  [name], DO PCP - General Family Medicine  11/22/15   Denton Brick., MD  Cardiology  11/22/15   Luellen Pucker, MD  Obstetrics and Gynecology  12/10/15   Efrain Sella, MD Referring Physician Internal Medicine  12/27/15   Juanito Doom, MD Consulting Physician Pulmonary Disease  10/02/16    Comment: pulm nodules    Chief Complaint  Patient presents with  . Annual Exam    Subjective:  Regina Black is a 62 y.o.  Female  present for CPE. All past medical history, surgical history, allergies, family history, immunizations, medications and social history were updated in the electronic medical record today. All recent labs, ED visits and hospitalizations within the last year were reviewed.  lung nodule/adenocarcinoma:  She has had 3 opinions. Now Diagnosed with Stage I adenocarcinoma of RUL 03/23/2017.Evaluation CT angiogram of the chest was performed on September 04, 2014 and that showed small areas of focal opacity noted in the upper lobes questionable for chronic areas of scarring but it may also reflect multifocal areas of active inflammation or infection. The largest area was in the right upper lobe and measured 1.3 cm in transverse dimension. There were other discrete nodules the largest in the right upper lobe near the minor fissure and measuring just under 4 mm.Repeat CT scan of the chest on 01/21/2016 showed stable multiple bilateral groundglass pulmonary nodules measuring up to 1.5 cm in the right upper lobe. Adenocarcinoma cannot be excluded. Patient underwent flexible video fiberoptic bronchoscopy with electromagnetic navigation and biopsies under the care of Dr. Lamonte Sakai on 03/05/2016. The final cytology and pathology from these biopsies were negative for malignancy. CT chest 07/18/16-persistent  ground-glass nodules in the upper lobes CT chest 02/18/2017 showed stable appearance of the bilateral groundglass and part solid nodules compared to 07/18/2016 suspicious for multifocal pulmonary adenocarcinoma. When compared to the study from 11/12/2014 the 1.4 cm left upper lobe groundglass nodule had increased in size from 0.7 cm. The other nodules have remained stable since that time. Patient referred to Dr. Roxan Hockey and on March 23, 2017 she underwent right VATS with wedge resection of the right upper lobe nodule as well as lymph node sampling. The final pathology ([id number]) showed moderately differentiated adenocarcinoma measuring 1.6 cm. The resection margin was negative for malignancy and there was no evidence for visceral pleural or lymphovascular invasion. No actionable mutations.  She met with Dr. Earlie Server on 04/23/17 to discuss further recommendations. Discussion with patient were several options including continuous observation and close monitoring for the slowly growing pulmonary nodules versus consideration of treatment with systemic chemotherapy versus stereotactic radiation or surgical resection again to any of these nodules start increasing in size in the future. She preferred observation.  Stage 1 NSCLC. Will need ongoing surveillance scans Left groundglass opacities. Continued close observation of these lesions with CT scans every 3 months for now recommended by Dr. Genene Churn, he states if CT changes occur would consider biopsy versus radiation oncology referral for consideration of SBRT.    Depression/anxiety: pt reports compliance with cymbalta 60 mg QD. She would like refills today.   Health maintenance:  Colonoscopy: 10/2008. Small hiatal hernia, 10 year follow up. She will wait until next year CPE for referral placement.   Mammogram: Dr. Corinna Capra GYN (11/09/2015). Birads1 Cervical cancer screening: Dr. Corinna Capra  GYN UTD 2018 Immunizations: flu shot UTD 2018.   tdap UTD 2017. PNA series  should be started. Looked through hospitalizations and did not see evidence of immunization. Infectious disease screening: hep c negative 08/2015  DEXA: Completed 07/26/15, pt reports it was normal. [facility] [place]. Records requested. Assistive device: none Oxygen BJY:NWGN Patient has a Dental home. Hospitalizations/ED visits: reviewed  Depression screen [facility] 2/9 10/13/2017 02/18/2017 11/07/2016 12/20/2015 11/22/2015  Decreased Interest - 0 0 0 0  Down, Depressed, Hopeless 0 0 0 0 0  PHQ - 2 Score 0 0 0 0 0  Altered sleeping 0 0 - - -  Tired, decreased energy 2 3 - - -  Change in appetite 0 0 - - -  Feeling bad or failure about yourself  0 0 - - -  Trouble concentrating 0 1 - - -  Moving slowly or fidgety/restless 0 0 - - -  Suicidal thoughts 0 0 - - -  PHQ-9 Score 2 4 - - -  Difficult doing work/chores - Somewhat difficult - - -   GAD 7 : Generalized Anxiety Score 10/13/2017  Nervous, Anxious, on Edge 0  Control/stop worrying 0  Worry too much - different things 0  Trouble relaxing 0  Restless 0  Easily annoyed or irritable 0  Afraid - awful might happen 0  Total GAD 7 Score 0     Current Exercise Habits: The patient does not participate in regular exercise at present Exercise limited by: Other - see comments(recent cancer Dx)   Immunization History  Administered Date(s) Administered  . Influenza Split 04/04/2016  . Influenza-Unspecified 03/23/2015, 04/23/2017  . Tdap 12/20/2015     Past Medical History:  Diagnosis Date  . Adenocarcinoma of lung ([place]) 03/26/2017  . Allergy   . Anxiety   . Arrhythmia   . Asthma    as a child  . Blood transfusion without reported diagnosis   . Chicken pox   . Depression   . Diverticulosis 2010   Mild left sided by EGD/Colonoscopy Dr. Alice Reichert  . Eczema   . Endometriosis    not aware of this  . Fatty liver   . GERD (gastroesophageal reflux disease)   . Headache   . Heart murmur   . Hiatal hernia 2010   "small"  . History of  cardiovascular stress test 04/2013   negative per OV note from Dr. Marijo File  . History of echocardiogram 04/2013   normal study; EF 75% per Dr. Marijo File  . History of EKG 2016   NSR with PVC, Dr. Marijo File  . History of Holter monitoring 2014   palpitations/pvc  . Hyperlipidemia   . Hypertension   . Hypokalemia   . Internal hemorrhoid 2010   small  . Osteoarthritis   . PCR DNA positive for HSV1   . PONV (postoperative nausea and vomiting)   . Prediabetes   . PVC (premature ventricular contraction) 2014   palpitations  . Thyroid nodule   . Vertigo   . Vitamin D deficiency    Allergies  Allergen Reactions  . Other Other (See Comments)    Pt was young adult, had tonsils removed - pt hemorrhaged after the procedure   . Butorphanol Other (See Comments)    Syncope UNABLE TO MOVE LIMBS  . Codeine Nausea And Vomiting   Past Surgical History:  Procedure Laterality Date  . BREAST SURGERY     reduction  . COLONOSCOPY  2010  . ESOPHAGOGASTRODUODENOSCOPY  2010  . EXPLORATORY LAPAROTOMY  endometriosis  . LASIK    . RECTAL SURGERY    . RECTOCELE REPAIR  07/2014  . thyroid nodule asp  01/11/2013  . TONSILLECTOMY AND ADENOIDECTOMY    . TUBAL LIGATION    . VIDEO ASSISTED THORACOSCOPY (VATS)/WEDGE RESECTION Right 03/23/2017   Procedure: VIDEO ASSISTED THORACOSCOPY (VATS)/WEDGE RESECTION;  Surgeon: [name], MD;  Location: [place];  Service: Thoracic;  Laterality: Right;  Marland Kitchen VIDEO BRONCHOSCOPY WITH ENDOBRONCHIAL NAVIGATION N/A 03/05/2016   Procedure: VIDEO BRONCHOSCOPY WITH ENDOBRONCHIAL NAVIGATION;  Surgeon: Collene Gobble, MD;  Location: [facility];  Service: Thoracic;  Laterality: N/A;   Family History  Problem Relation Age of Onset  . Lung cancer Mother   . Heart disease Mother   . Alcohol abuse Father   . Heart disease Father   . Breast cancer Paternal Aunt 16  . Heart disease Maternal Grandmother   . Arthritis Maternal Grandmother   . Arthritis Maternal Grandfather     . Heart disease Maternal Grandfather   . Hearing loss Maternal Grandfather   . Stroke Maternal Grandfather   . Diabetes Paternal Grandmother   . Hearing loss Paternal Grandfather    Social History   Socioeconomic History  . Marital status: Married    Spouse name: Not on file  . Number of children: Not on file  . Years of education: Not on file  . Highest education level: Not on file  Occupational History  . Not on file  Social Needs  . Financial resource strain: Not on file  . Food insecurity:    Worry: Not on file    Inability: Not on file  . Transportation needs:    Medical: Not on file    Non-medical: Not on file  Tobacco Use  . Smoking status: Former Smoker    Packs/day: 2.00    Years: 25.00    Pack years: 50.00    Types: Cigarettes    Last attempt to quit: 06/09/1998    Years since quitting: 19.3  . Smokeless tobacco: Never Used  Substance and Sexual Activity  . Alcohol use: Yes    Alcohol/week: 1.2 oz    Types: 2 Glasses of wine per week  . Drug use: No  . Sexual activity: Yes    Birth control/protection: None  Lifestyle  . Physical activity:    Days per week: Not on file    Minutes per session: Not on file  . Stress: Not on file  Relationships  . Social connections:    Talks on phone: Not on file    Gets together: Not on file    Attends religious service: Not on file    Active member of club or organization: Not on file    Attends meetings of clubs or organizations: Not on file    Relationship status: Not on file  . Intimate partner violence:    Fear of current or ex partner: Not on file    Emotionally abused: Not on file    Physically abused: Not on file    Forced sexual activity: Not on file  Other Topics Concern  . Not on file  Social History Narrative   Married, Shanon Brow. No children.   Mini dachshund (saddie-mae)   HS diploma- Finance.    Former smoker, no etoh or drugs.   Drinks caffeine   Wears seatbelt   Exercises routinely   Smoke  detector in the home.    Forearms in the home, locked.    Feels safe in  her relationships.    Allergies as of 10/13/2017      Reactions   Other Other (See Comments)   Pt was young adult, had tonsils removed - pt hemorrhaged after the procedure    Butorphanol Other (See Comments)   Syncope UNABLE TO MOVE LIMBS   Codeine Nausea And Vomiting      Medication List        Accurate as of 10/13/17 11:55 AM. Always use your most recent med list.          acetaminophen 500 MG tablet Commonly known as:  TYLENOL Take 2 tablets (1,000 mg total) by mouth every 6 (six) hours as needed.   albuterol 108 (90 Base) MCG/ACT inhaler Commonly known as:  PROVENTIL HFA;VENTOLIN HFA Inhale 2 puffs into the lungs every 6 (six) hours as needed for wheezing or shortness of breath.   diphenhydrAMINE 25 MG tablet Commonly known as:  BENADRYL Take 50 mg at bedtime as needed by mouth.   DULoxetine 60 MG capsule Commonly known as:  CYMBALTA Take 1 capsule (60 mg total) by mouth daily. Needs office visit prior to anymore refills   fluticasone 50 MCG/ACT nasal spray Commonly known as:  FLONASE Place 2 sprays into both nostrils daily as needed (allergies/congestion).   hydrochlorothiazide 25 MG tablet Commonly known as:  HYDRODIURIL Take 25 mg by mouth daily.   ibuprofen 800 MG tablet Commonly known as:  ADVIL,MOTRIN Take 800 mg by mouth.   lisinopril 40 MG tablet Commonly known as:  PRINIVIL,ZESTRIL Take 40 mg by mouth daily.   loratadine 10 MG tablet Commonly known as:  CLARITIN Take 10 mg by mouth daily.   metoprolol succinate 50 MG 24 hr tablet Commonly known as:  TOPROL-XL Take 1 tablet (50 mg total) by mouth daily. Take with or immediately following a meal.   nystatin cream Commonly known as:  MYCOSTATIN Apply 1 application topically 2 (two) times daily.   Olopatadine HCl 0.2 % Soln Apply 1 drop to eye daily. 1 drop each eye daily   omeprazole 20 MG capsule Commonly known as:   PRILOSEC Take 20 mg by mouth daily.   penciclovir 1 % cream Commonly known as:  DENAVIR Apply 1 application topically every 4 (four) hours as needed (applied to lip area for recurrent cold sores).   polyethylene glycol packet Commonly known as:  MIRALAX / GLYCOLAX Take 17 g daily by mouth.   valACYclovir 1000 MG tablet Commonly known as:  VALTREX Take 1 g by mouth 2 (two) times daily as needed. For fever blisters/cold sores.       All past medical history, surgical history, allergies, family history, immunizations andmedications were updated in the EMR today and reviewed under the history and medication portions of their EMR.     No results found for this or any previous visit (from the past 2160 hour(s)).  Dg Chest 2 View  Result Date: 06/16/2017 CLINICAL DATA:  Post right VATS, lung cancer. EXAM: CHEST  2 VIEW COMPARISON:  05/28/2017.  CT 06/12/2017. FINDINGS: Postoperative changes on the right. Stable elevation of the right hemidiaphragm. Lungs are clear. Heart is normal size. No effusions or acute bony abnormality. IMPRESSION: Postoperative changes on the right.  No active disease. Electronically Signed   By: Rolm Baptise M.D.   On: 06/16/2017 09:34     ROS: 14 pt review of systems performed and negative (unless mentioned in an HPI)  Objective: BP 130/85 (BP Location: Right Arm, Patient Position: Sitting, Cuff Size:  Normal)   Pulse (!) 58   Temp 98 F (36.7 C)   Resp 20   Ht _0  (1.626 m)   Wt 187 lb (84.8 kg)   SpO2 98%   BMI 32.10 kg/m  Gen: Afebrile. No acute distress. Nontoxic in appearance, well-developed, well-nourished,  Nontoxic in appearance. Obese, caucasian female.  HENT: AT. [place]. Bilateral TM visualized and normal in appearance, normal external auditory canal. MMM, no oral lesions, adequate dentition. Bilateral nares within normal limits. Throat withoutno erythema, ulcerations or exudates. no Cough on exam, no hoarseness on exam. Eyes:Pupils Equal Round  Reactive to light, Extraocular movements intact,  Conjunctiva without redness, discharge or icterus. Neck/lymp/endocrine: Supple,no lymphadenopathy, no thyromegaly CV: RRR no murmur, no edema, +2/4 P posterior tibialis pulses. no carotid bruits. No JVD. Chest: CTAB, no wheeze, rhonchi or crackles. normal Respiratory effort. good Air movement. Abd: Soft. obese. NTND. BS present. no Masses palpated. No hepatosplenomegaly. No rebound tenderness or guarding. Skin: no rashes, purpura or petechiae. Warm and well-perfused. Skin intact. Neuro/Msk:  Normal gait. PERLA. EOMi. Alert. Oriented x3.  Cranial nerves II through XII intact. Muscle strength 5/5 upper/lower extremity. DTRs equal bilaterally. Psych: Normal affect, dress and demeanor. Normal speech. Normal thought content and judgment.  No exam data present  Assessment/plan: Regina Black is a 62 y.o. female present for CPE. Essential hypertension, benign/Hyperlipidemia, unspecified hyperlipidemia type/Cardiac arrhythmia, unspecified cardiac arrhythmia type/Morbid obesity ([place]) Stable. Meds are filled through her work. Continue lisinopril, metoprolol anmd HCTZ. - CBC w/Diff; Future - Comp Met (CMET); Future - Lipid panel; Future Abnormal TSH - TSH; Future Screening for diabetes mellitus - HgB A1c; Future  Adenocarcinoma of right lung [facility]) Continue routine followup with oncology.   Thyroid nodule TSH. Last thyroid scan 10/2016 --> looked smaller, and benign. Reviewed CT 06/2017 and appearance is unchanged.  - T4, free; Future - T3, free; Future  Fatigue, unspecified type/vit de deficiency - thyroid panel, vit d and b12 tested today.  - B12; Future  Depression with anxiety stable - DULoxetine (CYMBALTA) 60 MG capsule; Take 1 capsule (60 mg total) by mouth daily. Needs office visit prior to anymore refills  Dispense: 90 capsule; Refill: 1 - B12; Future - f/u 6 months  Encounter for preventive health examination Patient was  encouraged to exercise greater than 150 minutes a week. Patient was encouraged to choose a diet filled with fresh fruits and vegetables, and lean meats. AVS provided to patient today for education/recommendation on gender specific health and safety maintenance. Colonoscopy: 10/2008. Small hiatal hernia, 10 year follow up. She will wait until next year CPE for referral placement.   Mammogram: Dr. Corinna Capra GYN (11/09/2015). Birads1 Cervical cancer screening: Dr. Corinna Capra GYN UTD 2018 Immunizations: flu shot UTD 2018.   tdap UTD 2017. PNA series should be started. Looked through hospitalizations and did not see evidence of immunization. Infectious disease screening: hep c negative 08/2015  DEXA: Completed 07/26/15, pt reports it was normal. [facility], [place].    Return in about 6 months (around 04/15/2018) for HTN/depression.  Electronically signed by: Howard Pouch, DO [place]

## 2017-10-13 NOTE — Telephone Encounter (Signed)
Component Name 01/15/2017 05/19/2014 05/10/2013 04/15/2012 04/10/2011 03/07/2010 01/09/2009 05/13/2005 04/25/2004 04/24/2003   Gynecologic...           Negative fo...           Satisfactor...           unknown           Blood present            HPV Resul...           The PAP sme...                      Case Report  Interpretation  Specimen Adequacy  LMP  Comment  HPV Report, Addendum  LAB AP PAP EDUCATIONAL NOTE  PAP SMEAR   Normal pap HPV negative

## 2017-10-13 NOTE — Telephone Encounter (Signed)
It does not appear the pneumonia vaccine was given prior discharge. I would recommend when she returns for her labs, she should have the [place]. Please make her aware.

## 2017-10-13 NOTE — Telephone Encounter (Signed)
Spoke with patient schedule nurse visit.

## 2017-10-13 NOTE — Patient Instructions (Signed)

## 2017-10-14 ENCOUNTER — Ambulatory Visit (INDEPENDENT_AMBULATORY_CARE_PROVIDER_SITE_OTHER): Payer: BLUE CROSS/BLUE SHIELD

## 2017-10-14 ENCOUNTER — Other Ambulatory Visit (INDEPENDENT_AMBULATORY_CARE_PROVIDER_SITE_OTHER): Payer: BLUE CROSS/BLUE SHIELD | Admitting: Family Medicine

## 2017-10-14 DIAGNOSIS — R5383 Other fatigue: Secondary | ICD-10-CM | POA: Diagnosis not present

## 2017-10-14 DIAGNOSIS — Z23 Encounter for immunization: Secondary | ICD-10-CM

## 2017-10-14 DIAGNOSIS — E041 Nontoxic single thyroid nodule: Secondary | ICD-10-CM

## 2017-10-14 DIAGNOSIS — I10 Essential (primary) hypertension: Secondary | ICD-10-CM | POA: Diagnosis not present

## 2017-10-14 DIAGNOSIS — Z131 Encounter for screening for diabetes mellitus: Secondary | ICD-10-CM | POA: Diagnosis not present

## 2017-10-14 DIAGNOSIS — R7989 Other specified abnormal findings of blood chemistry: Secondary | ICD-10-CM

## 2017-10-14 DIAGNOSIS — F418 Other specified anxiety disorders: Secondary | ICD-10-CM | POA: Diagnosis not present

## 2017-10-14 DIAGNOSIS — E785 Hyperlipidemia, unspecified: Secondary | ICD-10-CM

## 2017-10-14 DIAGNOSIS — E559 Vitamin D deficiency, unspecified: Secondary | ICD-10-CM | POA: Diagnosis not present

## 2017-10-14 DIAGNOSIS — I499 Cardiac arrhythmia, unspecified: Secondary | ICD-10-CM | POA: Diagnosis not present

## 2017-10-14 LAB — CBC WITH DIFFERENTIAL/PLATELET
BASOS ABS: 0.1 10*3/uL (ref 0.0–0.1)
BASOS PCT: 1.3 % (ref 0.0–3.0)
EOS ABS: 0.6 10*3/uL (ref 0.0–0.7)
Eosinophils Relative: 10.1 % — ABNORMAL HIGH (ref 0.0–5.0)
HEMATOCRIT: 41.3 % (ref 36.0–46.0)
HEMOGLOBIN: 13.9 g/dL (ref 12.0–15.0)
Lymphocytes Relative: 24.9 % (ref 12.0–46.0)
Lymphs Abs: 1.5 10*3/uL (ref 0.7–4.0)
MCHC: 33.6 g/dL (ref 30.0–36.0)
MCV: 90.7 fl (ref 78.0–100.0)
MONO ABS: 0.4 10*3/uL (ref 0.1–1.0)
Monocytes Relative: 5.9 % (ref 3.0–12.0)
Neutro Abs: 3.5 10*3/uL (ref 1.4–7.7)
Neutrophils Relative %: 57.8 % (ref 43.0–77.0)
Platelets: 362 10*3/uL (ref 150.0–400.0)
RBC: 4.55 Mil/uL (ref 3.87–5.11)
RDW: 14.5 % (ref 11.5–15.5)
WBC: 6 10*3/uL (ref 4.0–10.5)

## 2017-10-14 LAB — COMPREHENSIVE METABOLIC PANEL
ALBUMIN: 4.3 g/dL (ref 3.5–5.2)
ALK PHOS: 104 U/L (ref 39–117)
ALT: 25 U/L (ref 0–35)
AST: 25 U/L (ref 0–37)
BUN: 22 mg/dL (ref 6–23)
CALCIUM: 9.9 mg/dL (ref 8.4–10.5)
CHLORIDE: 101 meq/L (ref 96–112)
CO2: 28 mEq/L (ref 19–32)
CREATININE: 0.91 mg/dL (ref 0.40–1.20)
GFR: 67.07 mL/min (ref >60.00)
Glucose, Bld: 111 mg/dL — ABNORMAL HIGH (ref 70–99)
Potassium: 4.4 mEq/L (ref 3.5–5.1)
SODIUM: 139 meq/L (ref 135–145)
TOTAL PROTEIN: 7.6 g/dL (ref 6.0–8.3)
Total Bilirubin: 0.4 mg/dL (ref 0.2–1.2)

## 2017-10-14 LAB — VITAMIN B12: Vitamin B-12: 359 pg/mL (ref 211–911)

## 2017-10-14 LAB — LIPID PANEL
CHOLESTEROL: 210 mg/dL — AB (ref 0–200)
HDL: 52.7 mg/dL (ref >39.00)
LDL CALC: 134 mg/dL — AB (ref 0–99)
NONHDL: 157.54
Total CHOL/HDL Ratio: 4
Triglycerides: 120 mg/dL (ref 0.0–149.0)
VLDL: 24 mg/dL (ref 0.0–40.0)

## 2017-10-14 LAB — HEMOGLOBIN A1C: Hgb A1c MFr Bld: 6 % (ref 4.6–6.5)

## 2017-10-14 LAB — T4, FREE: Free T4: 0.74 ng/dL (ref 0.60–1.60)

## 2017-10-14 LAB — TSH: TSH: 0.37 u[IU]/mL (ref 0.35–4.50)

## 2017-10-14 LAB — T3, FREE: T3, Free: 4.1 pg/mL (ref 2.3–4.2)

## 2017-10-14 LAB — VITAMIN D 25 HYDROXY (VIT D DEFICIENCY, FRACTURES): VITD: 33.87 ng/mL (ref 30.00–100.00)

## 2017-10-14 NOTE — Progress Notes (Addendum)
Patient presents today for Pneumococcal 13 injection. Given with no incidence or problems.   Medical screening examination/treatment/procedure(s) were performed by non-physician practitioner and as supervising physician I was immediately available for consultation/collaboration.  I agree with above assessment and plan.  Electronically Signed by: Howard Pouch, DO [place] [facility] [place]

## 2017-10-14 NOTE — Progress Notes (Addendum)
Patient presented for fasting labs today, during blood draw patient stated that she was going to be sick.  At that time I was almost finished with phlebotomy so I stopped drawing and wrapped patient up.  After wrapping her arm is when she passed out, I called for help and we set up fan, and got patient cool pack for her head and neck.  Dr Anitra Lauth came into lab as patient did not wake up for a several minutes.  Dr Anitra Lauth did check patients heart and pulse and stated it was normal at time of exam.   Once patient felt better, we moved her into room for Dr Raoul Pitch to examine patient.     BP 120/62   Pulse 84  Pt recovered from vasovagal episode after resting with ice pack, drinking water and eating a small piece of candy. She experiences vasovagal episodes frequently with blood draws. A note made in her chart and pt instructed to tell lab personal in the future and she will have lab draws in the procedure room laying down.   Howard Pouch, DO

## 2017-10-15 ENCOUNTER — Telehealth: Payer: Self-pay | Admitting: Family Medicine

## 2017-10-15 ENCOUNTER — Other Ambulatory Visit: Payer: Self-pay | Admitting: Medical Oncology

## 2017-10-15 ENCOUNTER — Other Ambulatory Visit: Payer: BLUE CROSS/BLUE SHIELD

## 2017-10-15 ENCOUNTER — Encounter: Payer: Self-pay | Admitting: Family Medicine

## 2017-10-15 DIAGNOSIS — C3491 Malignant neoplasm of unspecified part of right bronchus or lung: Secondary | ICD-10-CM

## 2017-10-15 DIAGNOSIS — R7303 Prediabetes: Secondary | ICD-10-CM

## 2017-10-15 NOTE — Telephone Encounter (Signed)
Spoke with patient reviewed lab results and instructions. Patient verbalized understanding. 

## 2017-10-15 NOTE — Telephone Encounter (Signed)
Please inform patient the following information: Overall her labs are good. The exceptions are an elevated glucose and DM screen in the prediabetic range 6.0. Decreasing sugar consumed and increasing exercise will help her avoid progressing to diabetes. We will recheck this every 6 months (with other chronic medical condition). Her cholesterol is improved from last year.  Her thyroid panel, vit d, and b12 are normal. Her B12 is mid to low range and she may see some increased energy benefit with taking a daily OTC supplement of b12.

## 2017-10-15 NOTE — Addendum Note (Signed)
Addended by: Howard Pouch A on: 10/15/2017 09:07 AM   Modules accepted: Level of Service

## 2017-10-16 ENCOUNTER — Inpatient Hospital Stay: Payer: BLUE CROSS/BLUE SHIELD

## 2017-10-19 ENCOUNTER — Encounter: Payer: Self-pay | Admitting: *Deleted

## 2017-10-19 ENCOUNTER — Telehealth: Payer: Self-pay | Admitting: Family Medicine

## 2017-10-19 DIAGNOSIS — J3089 Other allergic rhinitis: Secondary | ICD-10-CM | POA: Diagnosis not present

## 2017-10-19 DIAGNOSIS — L700 Acne vulgaris: Secondary | ICD-10-CM | POA: Diagnosis not present

## 2017-10-19 DIAGNOSIS — R110 Nausea: Secondary | ICD-10-CM | POA: Diagnosis not present

## 2017-10-19 DIAGNOSIS — I10 Essential (primary) hypertension: Secondary | ICD-10-CM | POA: Diagnosis not present

## 2017-10-19 NOTE — Telephone Encounter (Signed)
Lab results released. Patient notified by [facility] Chart message

## 2017-10-19 NOTE — Telephone Encounter (Signed)
Copied from [place] [id number]. Topic: Quick Communication - See Telephone Encounter >> Oct 19, 2017 10:52 AM Ahmed Prima L wrote: CRM for notification. See Telephone encounter for: 10/19/17.  Patient said she received her lab results by phone on Thursday 5/9. She would like them to be uploaded into her mychart as well.

## 2017-10-20 ENCOUNTER — Ambulatory Visit: Payer: BLUE CROSS/BLUE SHIELD | Admitting: Internal Medicine

## 2017-11-18 DIAGNOSIS — L237 Allergic contact dermatitis due to plants, except food: Secondary | ICD-10-CM | POA: Diagnosis not present

## 2017-11-25 DIAGNOSIS — N951 Menopausal and female climacteric states: Secondary | ICD-10-CM | POA: Diagnosis not present

## 2017-11-25 DIAGNOSIS — Z1231 Encounter for screening mammogram for malignant neoplasm of breast: Secondary | ICD-10-CM | POA: Diagnosis not present

## 2017-11-25 DIAGNOSIS — I10 Essential (primary) hypertension: Secondary | ICD-10-CM | POA: Diagnosis not present

## 2017-11-25 DIAGNOSIS — M546 Pain in thoracic spine: Secondary | ICD-10-CM | POA: Diagnosis not present

## 2017-11-25 DIAGNOSIS — C3491 Malignant neoplasm of unspecified part of right bronchus or lung: Secondary | ICD-10-CM | POA: Diagnosis not present

## 2017-12-02 DIAGNOSIS — J069 Acute upper respiratory infection, unspecified: Secondary | ICD-10-CM | POA: Diagnosis not present

## 2017-12-14 DIAGNOSIS — R918 Other nonspecific abnormal finding of lung field: Secondary | ICD-10-CM | POA: Diagnosis not present

## 2017-12-14 DIAGNOSIS — C3411 Malignant neoplasm of upper lobe, right bronchus or lung: Secondary | ICD-10-CM | POA: Diagnosis not present

## 2017-12-14 DIAGNOSIS — C3491 Malignant neoplasm of unspecified part of right bronchus or lung: Secondary | ICD-10-CM | POA: Diagnosis not present

## 2017-12-14 DIAGNOSIS — Z85118 Personal history of other malignant neoplasm of bronchus and lung: Secondary | ICD-10-CM | POA: Diagnosis not present

## 2017-12-15 ENCOUNTER — Other Ambulatory Visit: Payer: BLUE CROSS/BLUE SHIELD

## 2017-12-16 DIAGNOSIS — E538 Deficiency of other specified B group vitamins: Secondary | ICD-10-CM | POA: Diagnosis not present

## 2017-12-21 ENCOUNTER — Ambulatory Visit: Payer: BLUE CROSS/BLUE SHIELD | Admitting: Internal Medicine

## 2017-12-21 ENCOUNTER — Telehealth: Payer: Self-pay

## 2017-12-21 NOTE — Telephone Encounter (Signed)
Patient called and stated that she wished to cancel her appointment.  When asked if she would like to reschedule she stated, "not at this time."  I acknowledged receipt and advised her to contact the office when she does.  She acknowledged receipt.

## 2017-12-24 DIAGNOSIS — H1030 Unspecified acute conjunctivitis, unspecified eye: Secondary | ICD-10-CM | POA: Diagnosis not present

## 2017-12-24 DIAGNOSIS — E538 Deficiency of other specified B group vitamins: Secondary | ICD-10-CM | POA: Diagnosis not present

## 2017-12-29 ENCOUNTER — Encounter: Payer: BLUE CROSS/BLUE SHIELD | Admitting: Thoracic Surgery (Cardiothoracic Vascular Surgery)

## 2018-01-01 DIAGNOSIS — E538 Deficiency of other specified B group vitamins: Secondary | ICD-10-CM | POA: Diagnosis not present

## 2018-02-09 DIAGNOSIS — N816 Rectocele: Secondary | ICD-10-CM | POA: Diagnosis not present

## 2018-02-09 DIAGNOSIS — K5900 Constipation, unspecified: Secondary | ICD-10-CM | POA: Diagnosis not present

## 2018-02-09 DIAGNOSIS — R109 Unspecified abdominal pain: Secondary | ICD-10-CM | POA: Diagnosis not present

## 2018-02-09 DIAGNOSIS — Z01419 Encounter for gynecological examination (general) (routine) without abnormal findings: Secondary | ICD-10-CM | POA: Diagnosis not present

## 2018-02-09 DIAGNOSIS — N941 Unspecified dyspareunia: Secondary | ICD-10-CM | POA: Diagnosis not present

## 2018-02-09 DIAGNOSIS — N952 Postmenopausal atrophic vaginitis: Secondary | ICD-10-CM | POA: Diagnosis not present

## 2018-03-04 DIAGNOSIS — J3089 Other allergic rhinitis: Secondary | ICD-10-CM | POA: Diagnosis not present

## 2018-03-04 DIAGNOSIS — K219 Gastro-esophageal reflux disease without esophagitis: Secondary | ICD-10-CM | POA: Diagnosis not present

## 2018-03-04 DIAGNOSIS — I10 Essential (primary) hypertension: Secondary | ICD-10-CM | POA: Diagnosis not present

## 2018-03-04 DIAGNOSIS — M129 Arthropathy, unspecified: Secondary | ICD-10-CM | POA: Diagnosis not present

## 2018-03-23 DIAGNOSIS — L821 Other seborrheic keratosis: Secondary | ICD-10-CM | POA: Diagnosis not present

## 2018-03-23 DIAGNOSIS — L718 Other rosacea: Secondary | ICD-10-CM | POA: Diagnosis not present

## 2018-03-23 DIAGNOSIS — D1801 Hemangioma of skin and subcutaneous tissue: Secondary | ICD-10-CM | POA: Diagnosis not present

## 2018-03-23 DIAGNOSIS — D225 Melanocytic nevi of trunk: Secondary | ICD-10-CM | POA: Diagnosis not present

## 2018-03-26 DIAGNOSIS — Z23 Encounter for immunization: Secondary | ICD-10-CM | POA: Diagnosis not present

## 2018-04-15 ENCOUNTER — Ambulatory Visit: Payer: BLUE CROSS/BLUE SHIELD | Admitting: Family Medicine

## 2018-04-16 DIAGNOSIS — C3411 Malignant neoplasm of upper lobe, right bronchus or lung: Secondary | ICD-10-CM | POA: Diagnosis not present

## 2018-04-16 DIAGNOSIS — R918 Other nonspecific abnormal finding of lung field: Secondary | ICD-10-CM | POA: Diagnosis not present

## 2018-04-16 DIAGNOSIS — Z902 Acquired absence of lung [part of]: Secondary | ICD-10-CM | POA: Diagnosis not present

## 2018-04-16 DIAGNOSIS — Z85118 Personal history of other malignant neoplasm of bronchus and lung: Secondary | ICD-10-CM | POA: Diagnosis not present

## 2018-04-16 DIAGNOSIS — Z08 Encounter for follow-up examination after completed treatment for malignant neoplasm: Secondary | ICD-10-CM | POA: Diagnosis not present

## 2018-04-20 DIAGNOSIS — C3491 Malignant neoplasm of unspecified part of right bronchus or lung: Secondary | ICD-10-CM | POA: Diagnosis not present

## 2018-05-05 ENCOUNTER — Other Ambulatory Visit: Payer: Self-pay | Admitting: Family Medicine

## 2018-05-05 DIAGNOSIS — F418 Other specified anxiety disorders: Secondary | ICD-10-CM

## 2018-05-10 DIAGNOSIS — E782 Mixed hyperlipidemia: Secondary | ICD-10-CM | POA: Diagnosis not present

## 2018-05-10 DIAGNOSIS — N951 Menopausal and female climacteric states: Secondary | ICD-10-CM | POA: Diagnosis not present

## 2018-05-10 DIAGNOSIS — R635 Abnormal weight gain: Secondary | ICD-10-CM | POA: Diagnosis not present

## 2018-05-10 DIAGNOSIS — E8881 Metabolic syndrome: Secondary | ICD-10-CM | POA: Diagnosis not present

## 2018-05-10 DIAGNOSIS — R7301 Impaired fasting glucose: Secondary | ICD-10-CM | POA: Diagnosis not present

## 2018-05-10 DIAGNOSIS — R945 Abnormal results of liver function studies: Secondary | ICD-10-CM | POA: Diagnosis not present

## 2018-05-12 DIAGNOSIS — R7301 Impaired fasting glucose: Secondary | ICD-10-CM | POA: Diagnosis not present

## 2018-05-12 DIAGNOSIS — Z1339 Encounter for screening examination for other mental health and behavioral disorders: Secondary | ICD-10-CM | POA: Diagnosis not present

## 2018-05-12 DIAGNOSIS — E8881 Metabolic syndrome: Secondary | ICD-10-CM | POA: Diagnosis not present

## 2018-05-12 DIAGNOSIS — R945 Abnormal results of liver function studies: Secondary | ICD-10-CM | POA: Diagnosis not present

## 2018-05-12 DIAGNOSIS — Z1331 Encounter for screening for depression: Secondary | ICD-10-CM | POA: Diagnosis not present

## 2018-05-12 DIAGNOSIS — E782 Mixed hyperlipidemia: Secondary | ICD-10-CM | POA: Diagnosis not present

## 2018-05-12 DIAGNOSIS — N951 Menopausal and female climacteric states: Secondary | ICD-10-CM | POA: Diagnosis not present

## 2018-05-13 DIAGNOSIS — L700 Acne vulgaris: Secondary | ICD-10-CM | POA: Diagnosis not present

## 2018-05-13 DIAGNOSIS — L720 Epidermal cyst: Secondary | ICD-10-CM | POA: Diagnosis not present

## 2018-06-04 DIAGNOSIS — J3089 Other allergic rhinitis: Secondary | ICD-10-CM | POA: Diagnosis not present

## 2018-06-04 DIAGNOSIS — M179 Osteoarthritis of knee, unspecified: Secondary | ICD-10-CM | POA: Diagnosis not present

## 2018-06-04 DIAGNOSIS — K219 Gastro-esophageal reflux disease without esophagitis: Secondary | ICD-10-CM | POA: Diagnosis not present

## 2018-06-04 DIAGNOSIS — I10 Essential (primary) hypertension: Secondary | ICD-10-CM | POA: Diagnosis not present

## 2018-06-14 DIAGNOSIS — L0291 Cutaneous abscess, unspecified: Secondary | ICD-10-CM | POA: Diagnosis not present

## 2018-06-14 DIAGNOSIS — H1030 Unspecified acute conjunctivitis, unspecified eye: Secondary | ICD-10-CM | POA: Diagnosis not present

## 2018-06-16 IMAGING — US US ABDOMEN LIMITED
1 series · 14 of 25 positions shown · non-contrast
Comparison: None.

CLINICAL DATA: Elevated LFTs

EXAM:
US ABDOMEN LIMITED - RIGHT UPPER QUADRANT

[Series 1: us abdomen limited · 0.20mm/px · 14 of 32 slices shown]
[im 1/32]
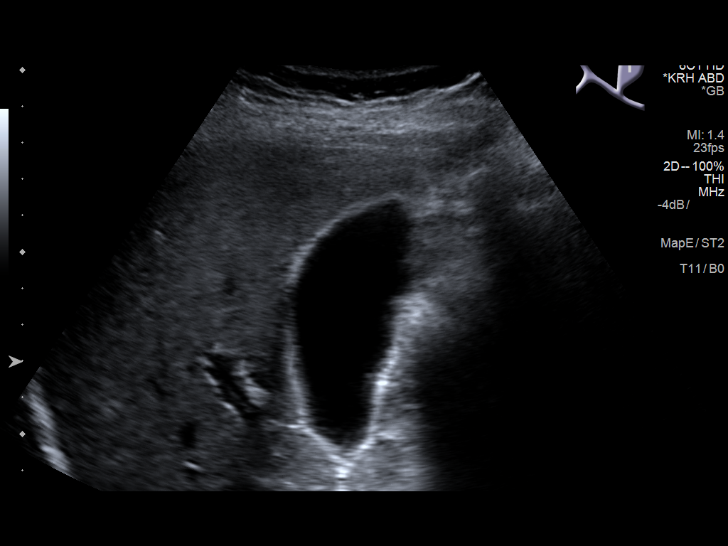
[im 3/32]
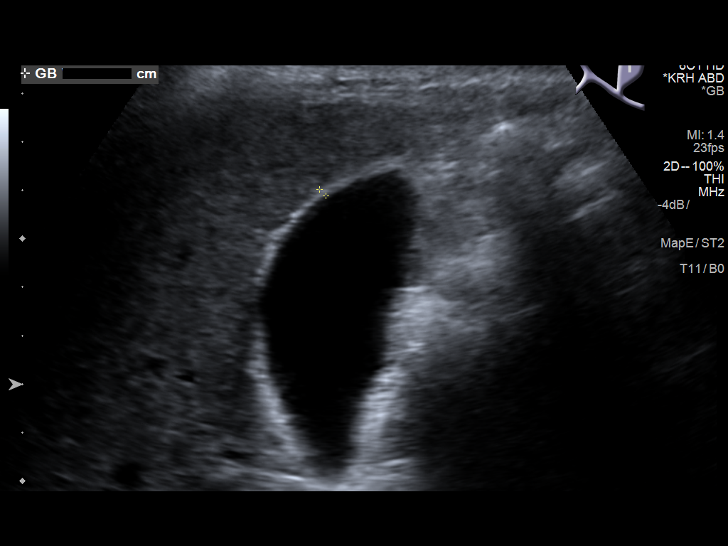
[im 6/32]
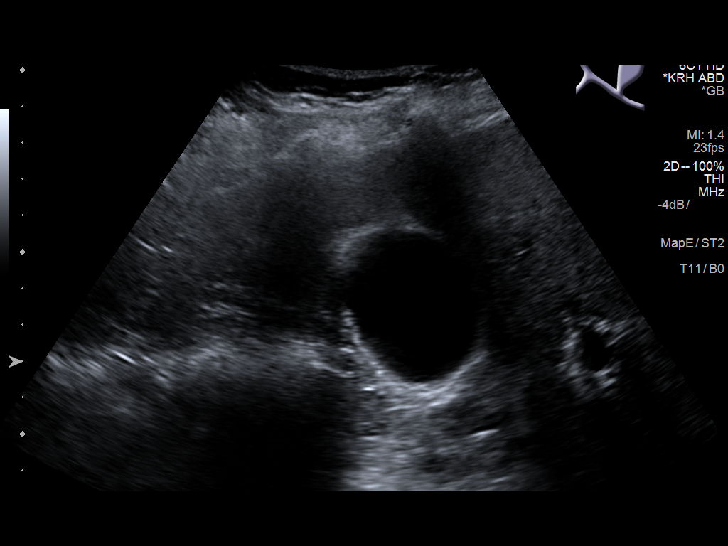
[im 8/32]
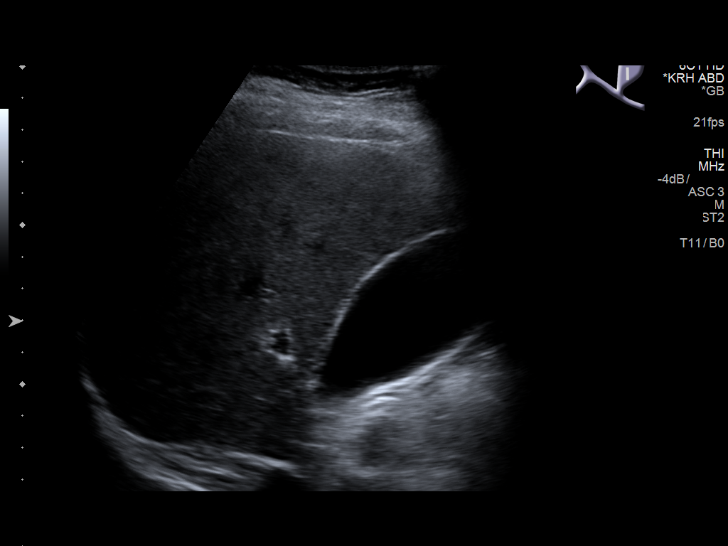
[im 11/32]
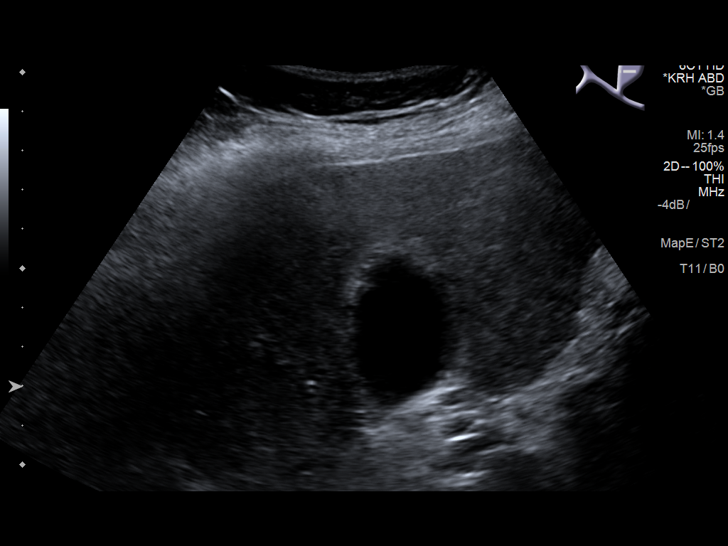
[im 12/32]
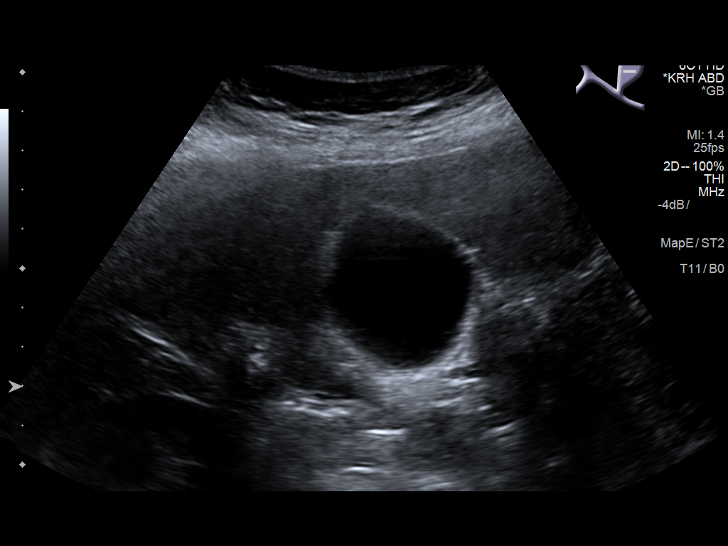
[im 15/32]
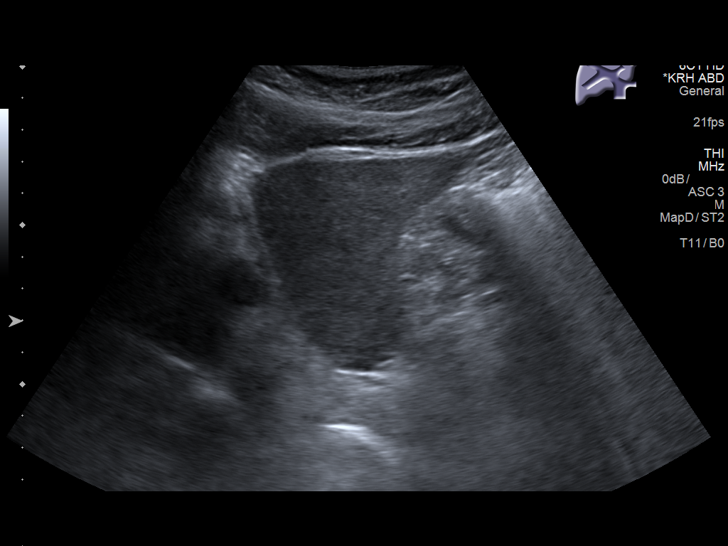
[im 17/32]
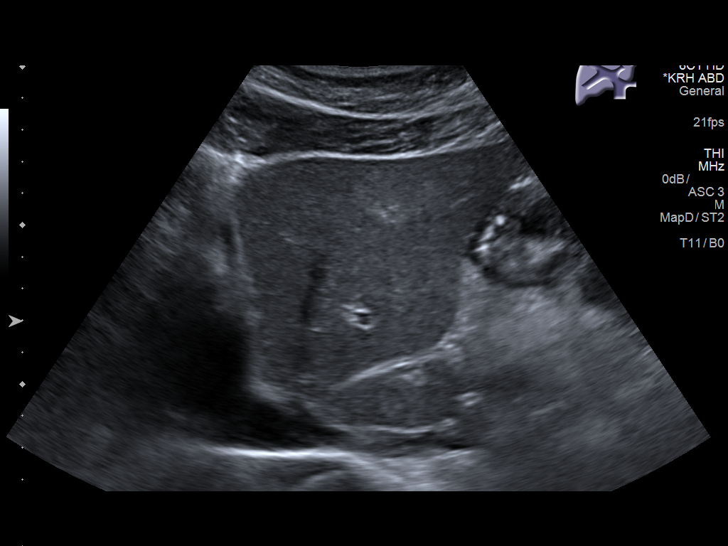
[im 20/32]
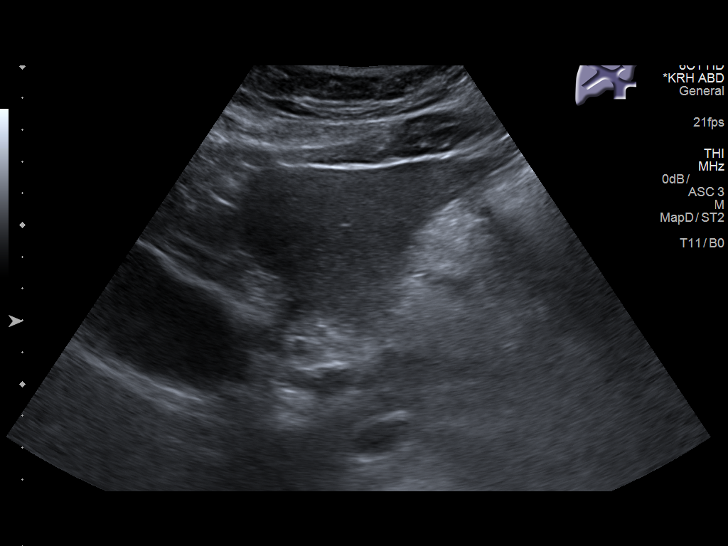
[im 21/32]
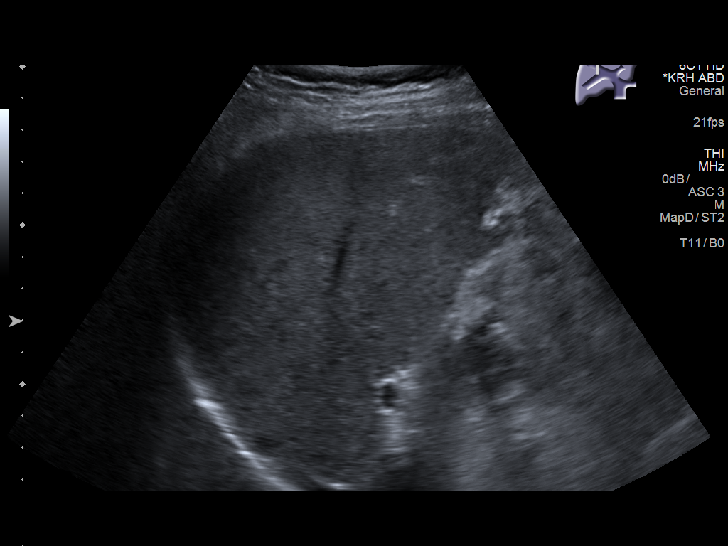
[im 24/32]
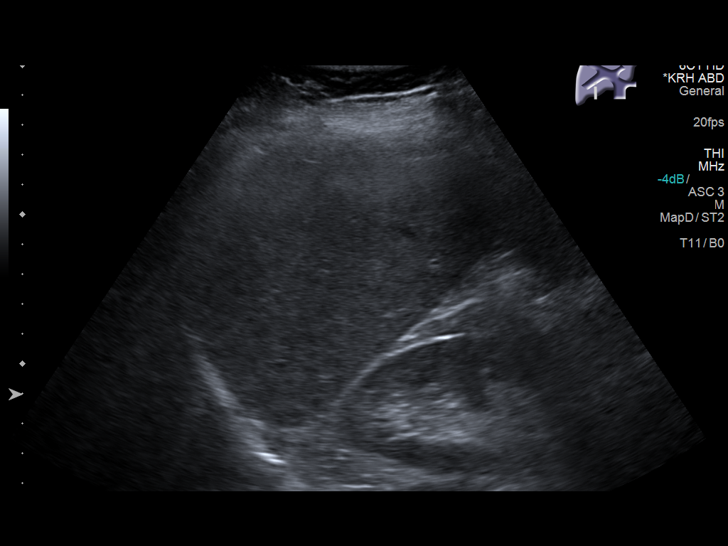
[im 26/32]
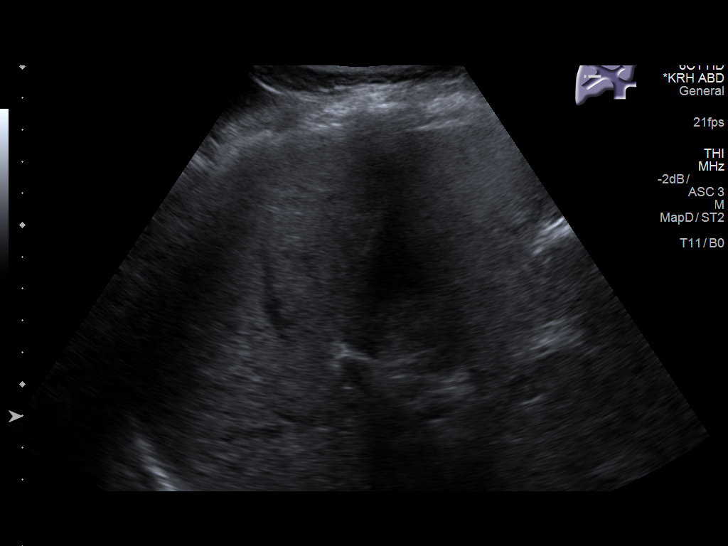
[im 29/32]
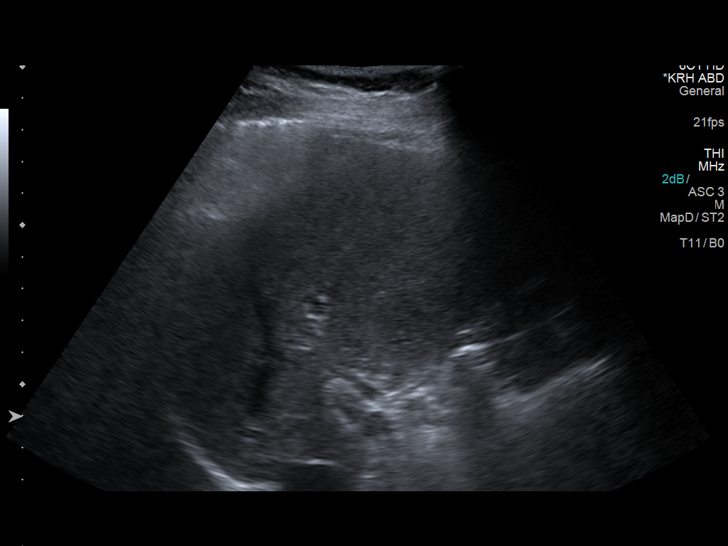
[im 32/32]
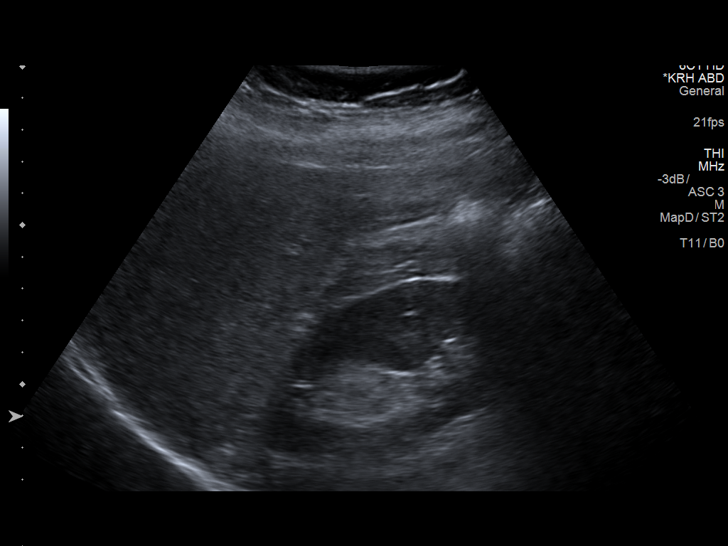

[14 of 25 positions shown; findings below may reference images not displayed]

FINDINGS: Gallbladder:

No gallstones or wall thickening visualized. No sonographic Murphy
sign noted by sonographer.

Common bile duct:

Diameter: Normal caliber, 4 mm

Liver:

No focal lesion identified. Within normal limits in parenchymal
echogenicity.
IMPRESSION: Unremarkable right upper quadrant ultrasound.

## 2018-06-28 DIAGNOSIS — H1013 Acute atopic conjunctivitis, bilateral: Secondary | ICD-10-CM | POA: Diagnosis not present

## 2018-07-07 DIAGNOSIS — K5900 Constipation, unspecified: Secondary | ICD-10-CM | POA: Diagnosis not present

## 2018-07-07 DIAGNOSIS — F418 Other specified anxiety disorders: Secondary | ICD-10-CM | POA: Diagnosis not present

## 2018-07-07 DIAGNOSIS — G4700 Insomnia, unspecified: Secondary | ICD-10-CM | POA: Diagnosis not present

## 2018-07-21 DIAGNOSIS — Z0189 Encounter for other specified special examinations: Secondary | ICD-10-CM | POA: Diagnosis not present

## 2018-07-21 DIAGNOSIS — Z760 Encounter for issue of repeat prescription: Secondary | ICD-10-CM | POA: Diagnosis not present

## 2018-07-21 DIAGNOSIS — M179 Osteoarthritis of knee, unspecified: Secondary | ICD-10-CM | POA: Diagnosis not present

## 2018-07-21 DIAGNOSIS — M791 Myalgia, unspecified site: Secondary | ICD-10-CM | POA: Diagnosis not present

## 2018-07-23 DIAGNOSIS — Z0189 Encounter for other specified special examinations: Secondary | ICD-10-CM | POA: Diagnosis not present

## 2018-07-23 DIAGNOSIS — Z0000 Encounter for general adult medical examination without abnormal findings: Secondary | ICD-10-CM | POA: Diagnosis not present

## 2018-07-29 DIAGNOSIS — J3089 Other allergic rhinitis: Secondary | ICD-10-CM | POA: Diagnosis not present

## 2018-07-29 DIAGNOSIS — J189 Pneumonia, unspecified organism: Secondary | ICD-10-CM | POA: Diagnosis not present

## 2018-08-06 DIAGNOSIS — J189 Pneumonia, unspecified organism: Secondary | ICD-10-CM | POA: Diagnosis not present

## 2018-08-06 DIAGNOSIS — J3089 Other allergic rhinitis: Secondary | ICD-10-CM | POA: Diagnosis not present

## 2018-08-06 DIAGNOSIS — G4700 Insomnia, unspecified: Secondary | ICD-10-CM | POA: Diagnosis not present

## 2018-08-06 DIAGNOSIS — Z760 Encounter for issue of repeat prescription: Secondary | ICD-10-CM | POA: Diagnosis not present

## 2018-08-17 DIAGNOSIS — Z885 Allergy status to narcotic agent status: Secondary | ICD-10-CM | POA: Diagnosis not present

## 2018-08-17 DIAGNOSIS — Z08 Encounter for follow-up examination after completed treatment for malignant neoplasm: Secondary | ICD-10-CM | POA: Diagnosis not present

## 2018-08-17 DIAGNOSIS — Z85118 Personal history of other malignant neoplasm of bronchus and lung: Secondary | ICD-10-CM | POA: Diagnosis not present

## 2018-08-17 DIAGNOSIS — C3411 Malignant neoplasm of upper lobe, right bronchus or lung: Secondary | ICD-10-CM | POA: Diagnosis not present

## 2018-08-17 DIAGNOSIS — R918 Other nonspecific abnormal finding of lung field: Secondary | ICD-10-CM | POA: Diagnosis not present

## 2018-08-17 DIAGNOSIS — Z9889 Other specified postprocedural states: Secondary | ICD-10-CM | POA: Diagnosis not present

## 2018-08-17 DIAGNOSIS — C3491 Malignant neoplasm of unspecified part of right bronchus or lung: Secondary | ICD-10-CM | POA: Diagnosis not present

## 2018-08-17 DIAGNOSIS — Z888 Allergy status to other drugs, medicaments and biological substances status: Secondary | ICD-10-CM | POA: Diagnosis not present

## 2018-09-03 DIAGNOSIS — J3089 Other allergic rhinitis: Secondary | ICD-10-CM | POA: Diagnosis not present

## 2018-09-03 DIAGNOSIS — M179 Osteoarthritis of knee, unspecified: Secondary | ICD-10-CM | POA: Diagnosis not present

## 2018-09-03 DIAGNOSIS — I10 Essential (primary) hypertension: Secondary | ICD-10-CM | POA: Diagnosis not present

## 2018-09-03 DIAGNOSIS — Z760 Encounter for issue of repeat prescription: Secondary | ICD-10-CM | POA: Diagnosis not present

## 2018-11-17 DIAGNOSIS — R7309 Other abnormal glucose: Secondary | ICD-10-CM | POA: Diagnosis not present

## 2018-11-17 DIAGNOSIS — I10 Essential (primary) hypertension: Secondary | ICD-10-CM | POA: Diagnosis not present

## 2018-11-17 DIAGNOSIS — J3089 Other allergic rhinitis: Secondary | ICD-10-CM | POA: Diagnosis not present

## 2018-11-17 DIAGNOSIS — K219 Gastro-esophageal reflux disease without esophagitis: Secondary | ICD-10-CM | POA: Diagnosis not present

## 2018-11-17 DIAGNOSIS — Z760 Encounter for issue of repeat prescription: Secondary | ICD-10-CM | POA: Diagnosis not present

## 2018-11-17 DIAGNOSIS — G4700 Insomnia, unspecified: Secondary | ICD-10-CM | POA: Diagnosis not present

## 2018-11-29 DIAGNOSIS — Z1239 Encounter for other screening for malignant neoplasm of breast: Secondary | ICD-10-CM | POA: Diagnosis not present

## 2018-11-29 DIAGNOSIS — Z1231 Encounter for screening mammogram for malignant neoplasm of breast: Secondary | ICD-10-CM | POA: Diagnosis not present

## 2018-12-28 IMAGING — CT CT CHEST W/O CM
2 of 3 series · 15 of 36 positions shown, 18 images · non-contrast
Comparison: CT 02/22/2016

CLINICAL DATA: Routine follow-up.  Multiple pulmonary nodules

EXAM:
CT CHEST WITHOUT CONTRAST
TECHNIQUE: Multidetector CT imaging of the chest was performed following the
standard protocol without IV contrast.

[Series 2: thorax · axial · 0.72mm/px · z∈[-303,-35]mm · 12 of 158 slices shown, 15 images]
[im 12/158  mediastinal]
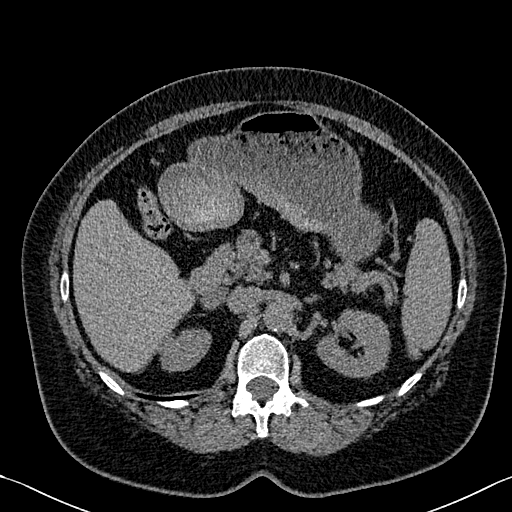
[im 12/158  lung]
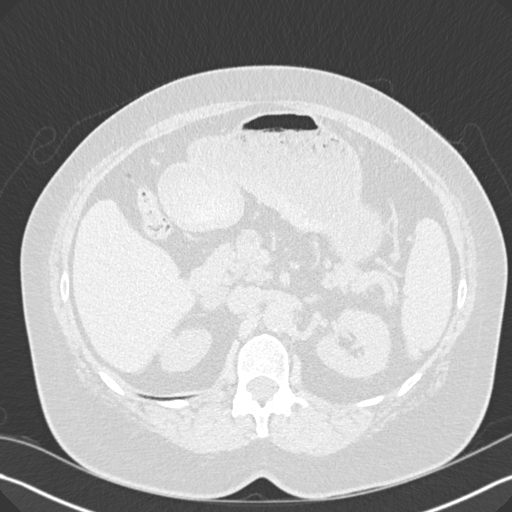
[im 24/158  lung]
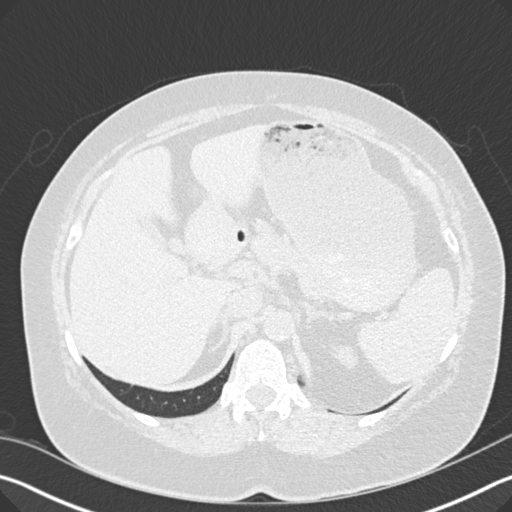
[im 35/158  lung]
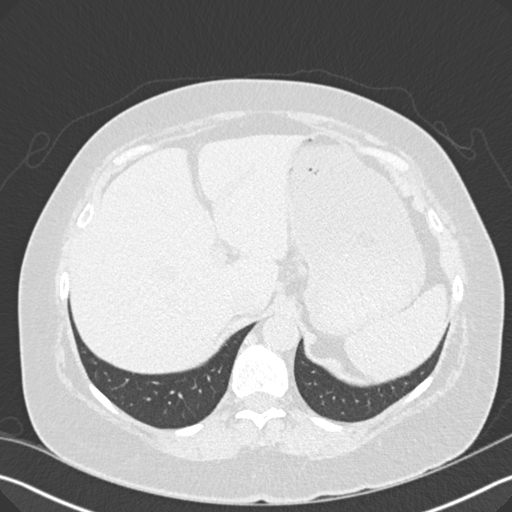
[im 47/158  lung]
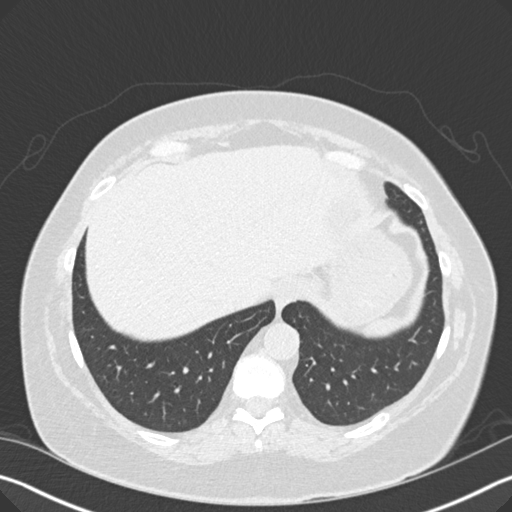
[im 59/158  mediastinal]
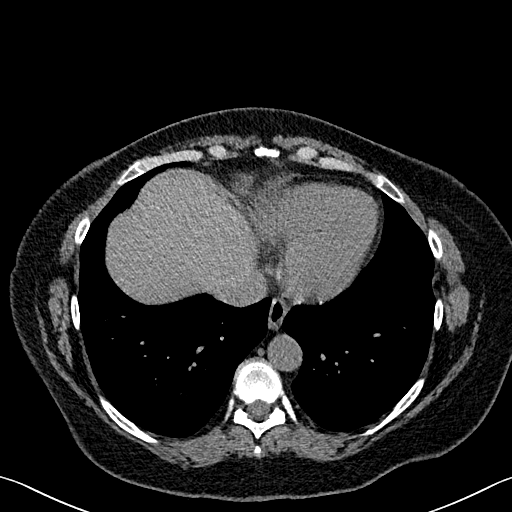
[im 59/158  lung]
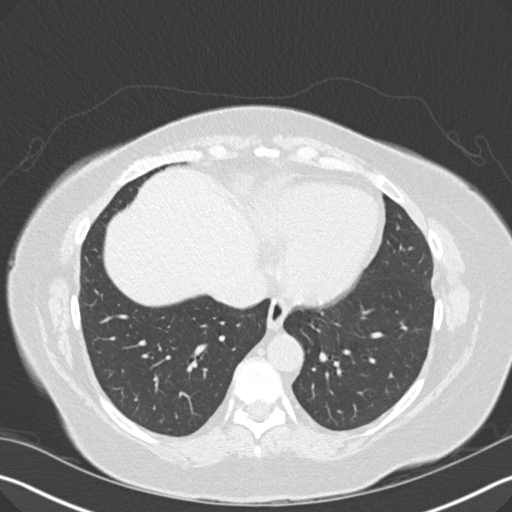
[im 70/158  lung]
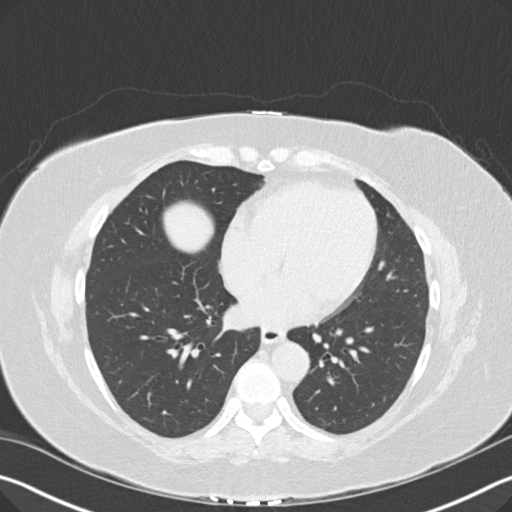
[im 88/158  lung]
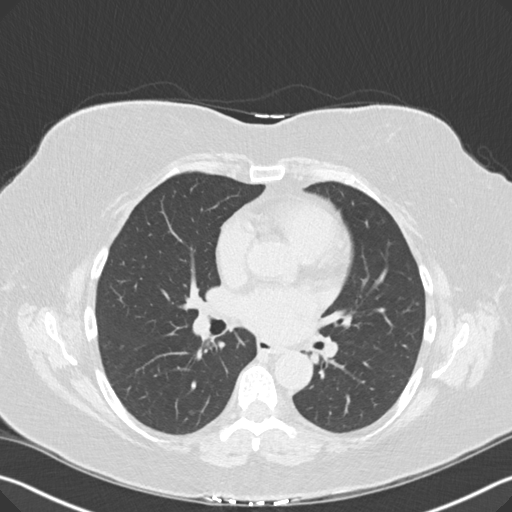
[im 99/158  lung]
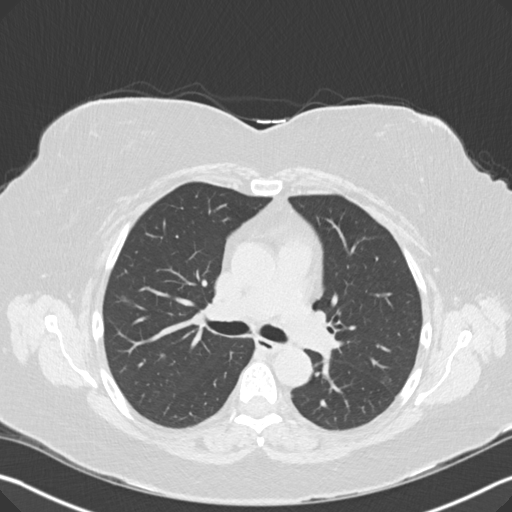
[im 111/158  mediastinal]
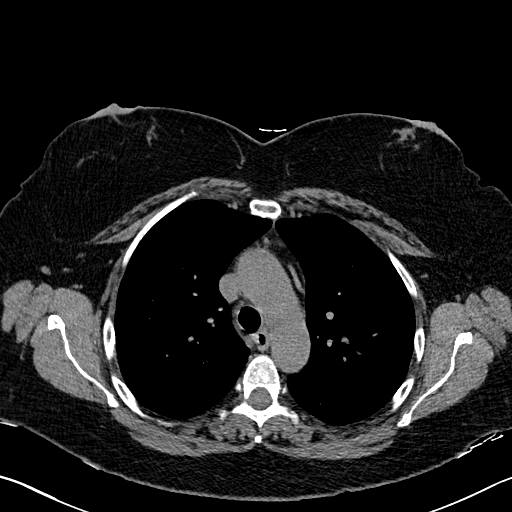
[im 111/158  lung]
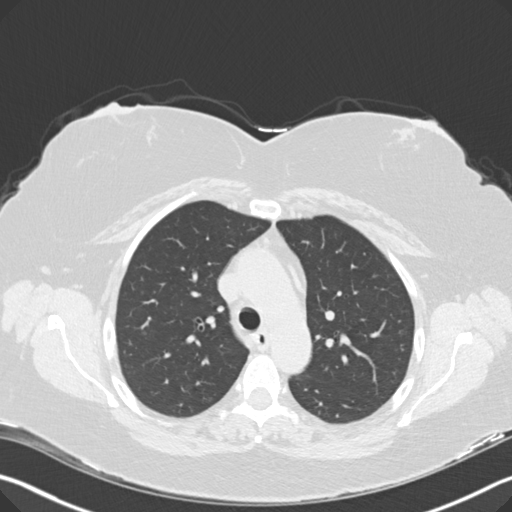
[im 123/158  lung]
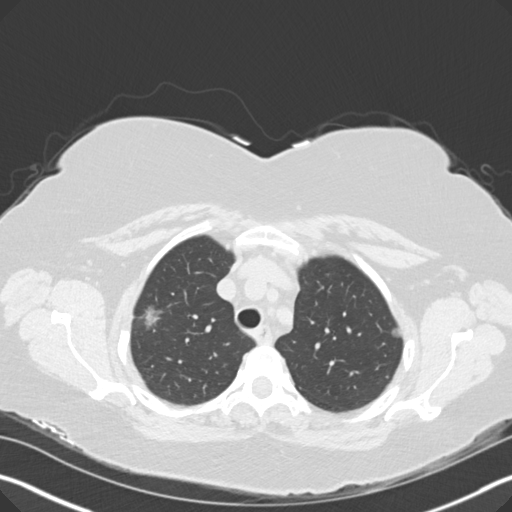
[im 134/158  lung]
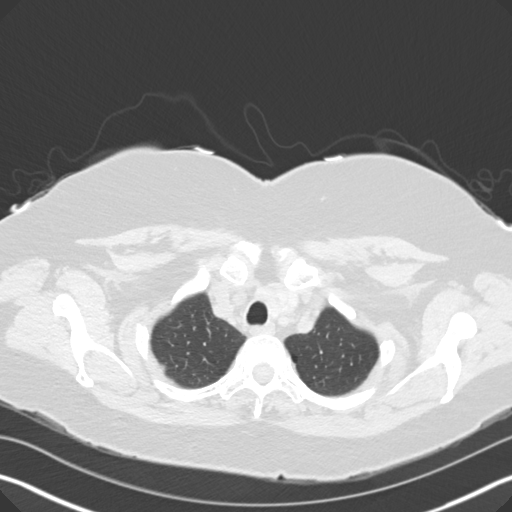
[im 146/158  lung]
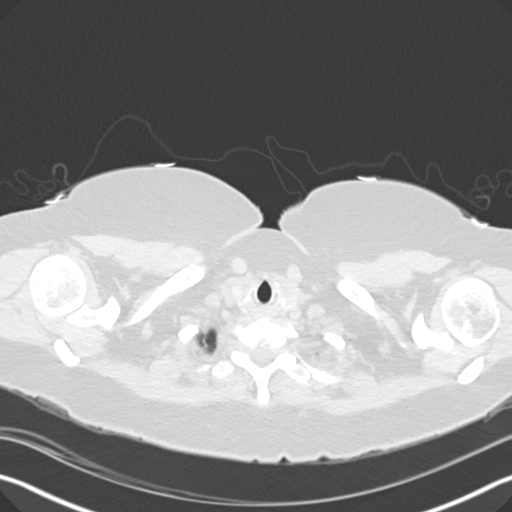

[Series 5: coronal · coronal · 0.62mm/px · 3 of 126 slices shown]
[im 26/126  lung]
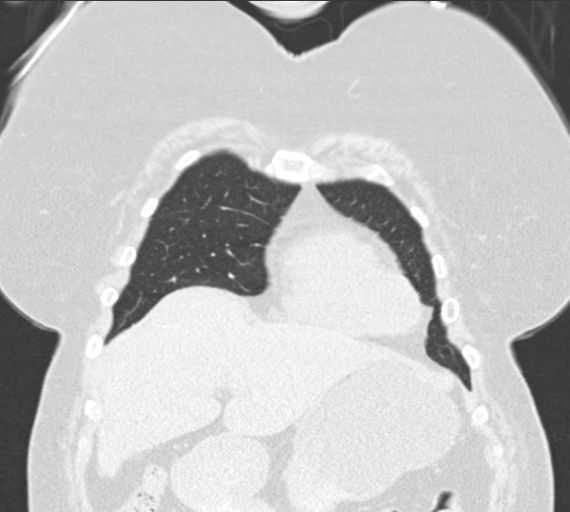
[im 51/126  lung]
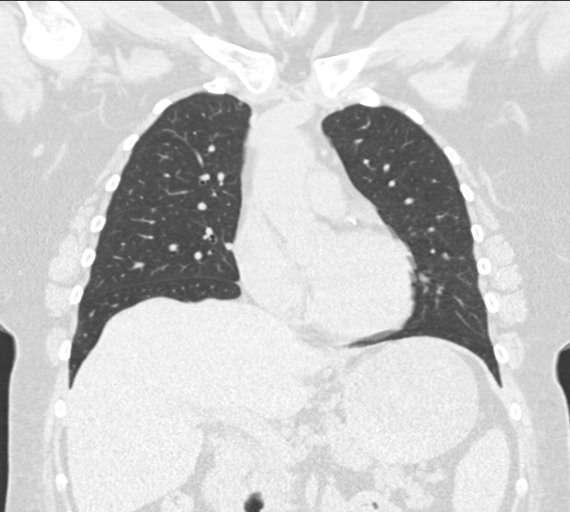
[im 76/126  lung]
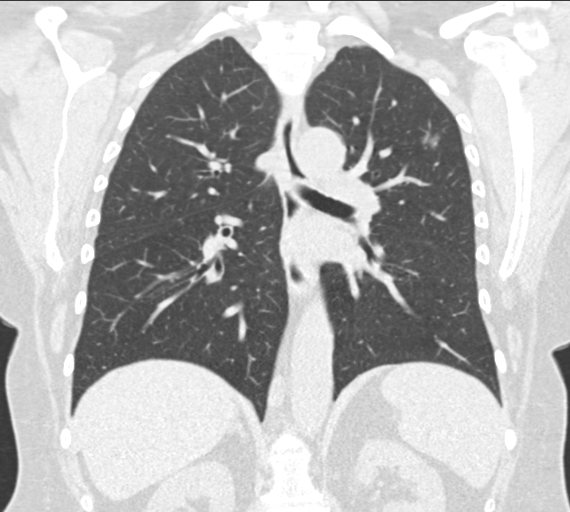

[15 of 36 positions shown; findings below may reference images not displayed]

FINDINGS: Cardiovascular: Coronary artery calcification and aortic
atherosclerotic calcification.

Mediastinum/Nodes: No axillary or supraclavicular adenopathy. No
mediastinal hilar adenopathy.

Lungs/Pleura: Bilateral upper lobe ground-glass nodules are
unchanged. RIGHT upper lobe nodule measures 12 mm (image 38, series
3) unchanged 12 mm. LEFT upper lobe nodule measures 11 mm (image 43,
series 4) unchanged from 10 mm. Several additional upper lobe
ground-glass nodules present. No new or enlarged nodules identified.

Upper Abdomen: Limited view of the liver, kidneys, pancreas are
unremarkable. Normal adrenal glands.

Musculoskeletal: No aggressive osseous lesion
IMPRESSION: 1. Persistent ground-glass nodules in the upper lobes. Recommend
additional non-contrast chest CT examinations at 2 and 4 years. This
recommendation follows the consensus statement: Guidelines for
Management of Incidental Pulmonary Nodules Detected on CT Images:

## 2019-01-07 DIAGNOSIS — M179 Osteoarthritis of knee, unspecified: Secondary | ICD-10-CM | POA: Diagnosis not present

## 2019-01-07 DIAGNOSIS — Z760 Encounter for issue of repeat prescription: Secondary | ICD-10-CM | POA: Diagnosis not present

## 2019-02-15 DIAGNOSIS — C3411 Malignant neoplasm of upper lobe, right bronchus or lung: Secondary | ICD-10-CM | POA: Diagnosis not present

## 2019-02-15 DIAGNOSIS — R918 Other nonspecific abnormal finding of lung field: Secondary | ICD-10-CM | POA: Diagnosis not present

## 2019-02-15 DIAGNOSIS — C3491 Malignant neoplasm of unspecified part of right bronchus or lung: Secondary | ICD-10-CM | POA: Diagnosis not present

## 2019-02-15 DIAGNOSIS — Z9889 Other specified postprocedural states: Secondary | ICD-10-CM | POA: Diagnosis not present

## 2019-02-18 DIAGNOSIS — C3412 Malignant neoplasm of upper lobe, left bronchus or lung: Secondary | ICD-10-CM | POA: Diagnosis not present

## 2019-02-18 DIAGNOSIS — R911 Solitary pulmonary nodule: Secondary | ICD-10-CM | POA: Diagnosis not present

## 2019-03-01 DIAGNOSIS — C3412 Malignant neoplasm of upper lobe, left bronchus or lung: Secondary | ICD-10-CM | POA: Diagnosis not present

## 2019-03-01 DIAGNOSIS — R918 Other nonspecific abnormal finding of lung field: Secondary | ICD-10-CM | POA: Diagnosis not present

## 2019-03-01 DIAGNOSIS — M954 Acquired deformity of chest and rib: Secondary | ICD-10-CM | POA: Diagnosis not present

## 2019-03-01 DIAGNOSIS — C3411 Malignant neoplasm of upper lobe, right bronchus or lung: Secondary | ICD-10-CM | POA: Diagnosis not present

## 2019-03-01 DIAGNOSIS — Z510 Encounter for antineoplastic radiation therapy: Secondary | ICD-10-CM | POA: Diagnosis not present

## 2019-03-01 DIAGNOSIS — I708 Atherosclerosis of other arteries: Secondary | ICD-10-CM | POA: Diagnosis not present

## 2019-03-01 DIAGNOSIS — R911 Solitary pulmonary nodule: Secondary | ICD-10-CM | POA: Diagnosis not present

## 2019-03-01 DIAGNOSIS — J9811 Atelectasis: Secondary | ICD-10-CM | POA: Diagnosis not present

## 2019-03-07 DIAGNOSIS — R911 Solitary pulmonary nodule: Secondary | ICD-10-CM | POA: Diagnosis not present

## 2019-03-07 DIAGNOSIS — Z510 Encounter for antineoplastic radiation therapy: Secondary | ICD-10-CM | POA: Diagnosis not present

## 2019-03-07 DIAGNOSIS — C3411 Malignant neoplasm of upper lobe, right bronchus or lung: Secondary | ICD-10-CM | POA: Diagnosis not present

## 2019-03-07 DIAGNOSIS — C3492 Malignant neoplasm of unspecified part of left bronchus or lung: Secondary | ICD-10-CM | POA: Diagnosis not present

## 2019-03-07 DIAGNOSIS — C3412 Malignant neoplasm of upper lobe, left bronchus or lung: Secondary | ICD-10-CM | POA: Diagnosis not present

## 2019-03-09 DIAGNOSIS — R7309 Other abnormal glucose: Secondary | ICD-10-CM | POA: Diagnosis not present

## 2019-03-09 DIAGNOSIS — K829 Disease of gallbladder, unspecified: Secondary | ICD-10-CM | POA: Diagnosis not present

## 2019-03-09 DIAGNOSIS — K5790 Diverticulosis of intestine, part unspecified, without perforation or abscess without bleeding: Secondary | ICD-10-CM | POA: Diagnosis not present

## 2019-03-09 DIAGNOSIS — Z760 Encounter for issue of repeat prescription: Secondary | ICD-10-CM | POA: Diagnosis not present

## 2019-03-14 DIAGNOSIS — C3412 Malignant neoplasm of upper lobe, left bronchus or lung: Secondary | ICD-10-CM | POA: Diagnosis not present

## 2019-03-21 DIAGNOSIS — C3412 Malignant neoplasm of upper lobe, left bronchus or lung: Secondary | ICD-10-CM | POA: Diagnosis not present

## 2019-03-21 DIAGNOSIS — C3492 Malignant neoplasm of unspecified part of left bronchus or lung: Secondary | ICD-10-CM | POA: Diagnosis not present

## 2019-03-21 DIAGNOSIS — Z510 Encounter for antineoplastic radiation therapy: Secondary | ICD-10-CM | POA: Diagnosis not present

## 2019-03-21 DIAGNOSIS — R911 Solitary pulmonary nodule: Secondary | ICD-10-CM | POA: Diagnosis not present

## 2019-03-21 DIAGNOSIS — C3411 Malignant neoplasm of upper lobe, right bronchus or lung: Secondary | ICD-10-CM | POA: Diagnosis not present

## 2019-03-22 DIAGNOSIS — C3411 Malignant neoplasm of upper lobe, right bronchus or lung: Secondary | ICD-10-CM | POA: Diagnosis not present

## 2019-03-22 DIAGNOSIS — C3412 Malignant neoplasm of upper lobe, left bronchus or lung: Secondary | ICD-10-CM | POA: Diagnosis not present

## 2019-03-22 DIAGNOSIS — Z510 Encounter for antineoplastic radiation therapy: Secondary | ICD-10-CM | POA: Diagnosis not present

## 2019-03-23 DIAGNOSIS — I10 Essential (primary) hypertension: Secondary | ICD-10-CM | POA: Diagnosis not present

## 2019-03-23 DIAGNOSIS — Z760 Encounter for issue of repeat prescription: Secondary | ICD-10-CM | POA: Diagnosis not present

## 2019-03-23 DIAGNOSIS — Z510 Encounter for antineoplastic radiation therapy: Secondary | ICD-10-CM | POA: Diagnosis not present

## 2019-03-23 DIAGNOSIS — F418 Other specified anxiety disorders: Secondary | ICD-10-CM | POA: Diagnosis not present

## 2019-03-23 DIAGNOSIS — C3412 Malignant neoplasm of upper lobe, left bronchus or lung: Secondary | ICD-10-CM | POA: Diagnosis not present

## 2019-03-23 DIAGNOSIS — C3411 Malignant neoplasm of upper lobe, right bronchus or lung: Secondary | ICD-10-CM | POA: Diagnosis not present

## 2019-03-23 DIAGNOSIS — J3089 Other allergic rhinitis: Secondary | ICD-10-CM | POA: Diagnosis not present

## 2019-03-24 DIAGNOSIS — C3411 Malignant neoplasm of upper lobe, right bronchus or lung: Secondary | ICD-10-CM | POA: Diagnosis not present

## 2019-03-24 DIAGNOSIS — C3412 Malignant neoplasm of upper lobe, left bronchus or lung: Secondary | ICD-10-CM | POA: Diagnosis not present

## 2019-03-24 DIAGNOSIS — Z510 Encounter for antineoplastic radiation therapy: Secondary | ICD-10-CM | POA: Diagnosis not present

## 2019-03-25 DIAGNOSIS — C3411 Malignant neoplasm of upper lobe, right bronchus or lung: Secondary | ICD-10-CM | POA: Diagnosis not present

## 2019-03-25 DIAGNOSIS — Z23 Encounter for immunization: Secondary | ICD-10-CM | POA: Diagnosis not present

## 2019-03-25 DIAGNOSIS — C3412 Malignant neoplasm of upper lobe, left bronchus or lung: Secondary | ICD-10-CM | POA: Diagnosis not present

## 2019-03-25 DIAGNOSIS — Z510 Encounter for antineoplastic radiation therapy: Secondary | ICD-10-CM | POA: Diagnosis not present

## 2019-04-27 DIAGNOSIS — Z711 Person with feared health complaint in whom no diagnosis is made: Secondary | ICD-10-CM | POA: Diagnosis not present

## 2019-04-27 DIAGNOSIS — Z03818 Encounter for observation for suspected exposure to other biological agents ruled out: Secondary | ICD-10-CM | POA: Diagnosis not present

## 2019-05-20 DIAGNOSIS — D128 Benign neoplasm of rectum: Secondary | ICD-10-CM | POA: Diagnosis not present

## 2019-05-20 DIAGNOSIS — K648 Other hemorrhoids: Secondary | ICD-10-CM | POA: Diagnosis not present

## 2019-05-20 DIAGNOSIS — Z1211 Encounter for screening for malignant neoplasm of colon: Secondary | ICD-10-CM | POA: Diagnosis not present

## 2019-05-20 DIAGNOSIS — D120 Benign neoplasm of cecum: Secondary | ICD-10-CM | POA: Diagnosis not present

## 2019-05-20 DIAGNOSIS — K5730 Diverticulosis of large intestine without perforation or abscess without bleeding: Secondary | ICD-10-CM | POA: Diagnosis not present

## 2019-05-20 DIAGNOSIS — D126 Benign neoplasm of colon, unspecified: Secondary | ICD-10-CM | POA: Diagnosis not present

## 2019-05-20 DIAGNOSIS — K6389 Other specified diseases of intestine: Secondary | ICD-10-CM | POA: Diagnosis not present

## 2019-06-02 DIAGNOSIS — Z0000 Encounter for general adult medical examination without abnormal findings: Secondary | ICD-10-CM | POA: Diagnosis not present

## 2019-06-02 DIAGNOSIS — R7301 Impaired fasting glucose: Secondary | ICD-10-CM | POA: Diagnosis not present

## 2019-06-07 DIAGNOSIS — I10 Essential (primary) hypertension: Secondary | ICD-10-CM | POA: Diagnosis not present

## 2019-06-07 DIAGNOSIS — Z0000 Encounter for general adult medical examination without abnormal findings: Secondary | ICD-10-CM | POA: Diagnosis not present

## 2019-06-07 DIAGNOSIS — R7301 Impaired fasting glucose: Secondary | ICD-10-CM | POA: Diagnosis not present

## 2019-06-07 DIAGNOSIS — K219 Gastro-esophageal reflux disease without esophagitis: Secondary | ICD-10-CM | POA: Diagnosis not present

## 2019-06-07 DIAGNOSIS — E785 Hyperlipidemia, unspecified: Secondary | ICD-10-CM | POA: Diagnosis not present

## 2019-06-15 DIAGNOSIS — C3412 Malignant neoplasm of upper lobe, left bronchus or lung: Secondary | ICD-10-CM | POA: Diagnosis not present

## 2019-06-15 DIAGNOSIS — Z23 Encounter for immunization: Secondary | ICD-10-CM | POA: Diagnosis not present

## 2019-06-23 DIAGNOSIS — R0602 Shortness of breath: Secondary | ICD-10-CM | POA: Diagnosis not present

## 2019-06-23 DIAGNOSIS — C3491 Malignant neoplasm of unspecified part of right bronchus or lung: Secondary | ICD-10-CM | POA: Diagnosis not present

## 2019-06-23 DIAGNOSIS — C3412 Malignant neoplasm of upper lobe, left bronchus or lung: Secondary | ICD-10-CM | POA: Diagnosis not present

## 2019-06-23 DIAGNOSIS — R918 Other nonspecific abnormal finding of lung field: Secondary | ICD-10-CM | POA: Diagnosis not present

## 2019-06-28 DIAGNOSIS — Z9889 Other specified postprocedural states: Secondary | ICD-10-CM | POA: Diagnosis not present

## 2019-06-28 DIAGNOSIS — C3412 Malignant neoplasm of upper lobe, left bronchus or lung: Secondary | ICD-10-CM | POA: Diagnosis not present

## 2019-06-28 DIAGNOSIS — F419 Anxiety disorder, unspecified: Secondary | ICD-10-CM | POA: Diagnosis not present

## 2019-06-28 DIAGNOSIS — J701 Chronic and other pulmonary manifestations due to radiation: Secondary | ICD-10-CM | POA: Diagnosis not present

## 2019-06-28 DIAGNOSIS — C3491 Malignant neoplasm of unspecified part of right bronchus or lung: Secondary | ICD-10-CM | POA: Diagnosis not present

## 2019-07-01 DIAGNOSIS — L738 Other specified follicular disorders: Secondary | ICD-10-CM | POA: Diagnosis not present

## 2019-07-01 DIAGNOSIS — L700 Acne vulgaris: Secondary | ICD-10-CM | POA: Diagnosis not present

## 2019-07-01 DIAGNOSIS — L0109 Other impetigo: Secondary | ICD-10-CM | POA: Diagnosis not present

## 2019-07-18 DIAGNOSIS — Z20822 Contact with and (suspected) exposure to covid-19: Secondary | ICD-10-CM | POA: Diagnosis not present

## 2019-07-18 DIAGNOSIS — Z01812 Encounter for preprocedural laboratory examination: Secondary | ICD-10-CM | POA: Diagnosis not present

## 2019-07-18 DIAGNOSIS — C3412 Malignant neoplasm of upper lobe, left bronchus or lung: Secondary | ICD-10-CM | POA: Diagnosis not present

## 2019-07-25 DIAGNOSIS — G4733 Obstructive sleep apnea (adult) (pediatric): Secondary | ICD-10-CM | POA: Diagnosis not present

## 2019-07-26 DIAGNOSIS — L600 Ingrowing nail: Secondary | ICD-10-CM | POA: Diagnosis not present

## 2019-07-26 DIAGNOSIS — R0602 Shortness of breath: Secondary | ICD-10-CM | POA: Diagnosis not present

## 2019-07-26 DIAGNOSIS — B351 Tinea unguium: Secondary | ICD-10-CM | POA: Diagnosis not present

## 2019-07-29 DIAGNOSIS — G43809 Other migraine, not intractable, without status migrainosus: Secondary | ICD-10-CM | POA: Diagnosis not present

## 2019-08-01 DIAGNOSIS — G4733 Obstructive sleep apnea (adult) (pediatric): Secondary | ICD-10-CM | POA: Diagnosis not present

## 2019-08-09 DIAGNOSIS — C3412 Malignant neoplasm of upper lobe, left bronchus or lung: Secondary | ICD-10-CM | POA: Diagnosis not present

## 2019-08-09 DIAGNOSIS — R918 Other nonspecific abnormal finding of lung field: Secondary | ICD-10-CM | POA: Diagnosis not present

## 2019-08-09 DIAGNOSIS — C3491 Malignant neoplasm of unspecified part of right bronchus or lung: Secondary | ICD-10-CM | POA: Diagnosis not present

## 2019-08-09 DIAGNOSIS — J449 Chronic obstructive pulmonary disease, unspecified: Secondary | ICD-10-CM | POA: Diagnosis not present

## 2019-08-24 DIAGNOSIS — G4733 Obstructive sleep apnea (adult) (pediatric): Secondary | ICD-10-CM | POA: Diagnosis not present

## 2019-09-02 DIAGNOSIS — Z20828 Contact with and (suspected) exposure to other viral communicable diseases: Secondary | ICD-10-CM | POA: Diagnosis not present

## 2019-09-02 DIAGNOSIS — U071 COVID-19: Secondary | ICD-10-CM | POA: Diagnosis not present

## 2019-09-02 DIAGNOSIS — Z03818 Encounter for observation for suspected exposure to other biological agents ruled out: Secondary | ICD-10-CM | POA: Diagnosis not present

## 2019-09-24 DIAGNOSIS — G4733 Obstructive sleep apnea (adult) (pediatric): Secondary | ICD-10-CM | POA: Diagnosis not present

## 2019-09-26 DIAGNOSIS — R918 Other nonspecific abnormal finding of lung field: Secondary | ICD-10-CM | POA: Diagnosis not present

## 2019-09-26 DIAGNOSIS — C3412 Malignant neoplasm of upper lobe, left bronchus or lung: Secondary | ICD-10-CM | POA: Diagnosis not present

## 2019-09-26 DIAGNOSIS — Z9889 Other specified postprocedural states: Secondary | ICD-10-CM | POA: Diagnosis not present

## 2019-09-27 DIAGNOSIS — J449 Chronic obstructive pulmonary disease, unspecified: Secondary | ICD-10-CM | POA: Diagnosis not present

## 2019-09-27 DIAGNOSIS — Z9989 Dependence on other enabling machines and devices: Secondary | ICD-10-CM | POA: Diagnosis not present

## 2019-09-27 DIAGNOSIS — G4733 Obstructive sleep apnea (adult) (pediatric): Secondary | ICD-10-CM | POA: Diagnosis not present

## 2019-09-27 DIAGNOSIS — C3491 Malignant neoplasm of unspecified part of right bronchus or lung: Secondary | ICD-10-CM | POA: Diagnosis not present

## 2019-09-30 DIAGNOSIS — Z23 Encounter for immunization: Secondary | ICD-10-CM | POA: Diagnosis not present

## 2019-10-24 DIAGNOSIS — G4733 Obstructive sleep apnea (adult) (pediatric): Secondary | ICD-10-CM | POA: Diagnosis not present

## 2019-10-28 DIAGNOSIS — Z23 Encounter for immunization: Secondary | ICD-10-CM | POA: Diagnosis not present

## 2019-11-01 DIAGNOSIS — G4733 Obstructive sleep apnea (adult) (pediatric): Secondary | ICD-10-CM | POA: Diagnosis not present

## 2019-11-07 IMAGING — CR DG CHEST 1V
1 series · 1 of 1 positions shown · non-contrast
Comparison: 04/14/2017.

CLINICAL DATA: Severe productive cough for 1 week. Status post
VATS.

EXAM:
CHEST 1 VIEW

[w chest pa]
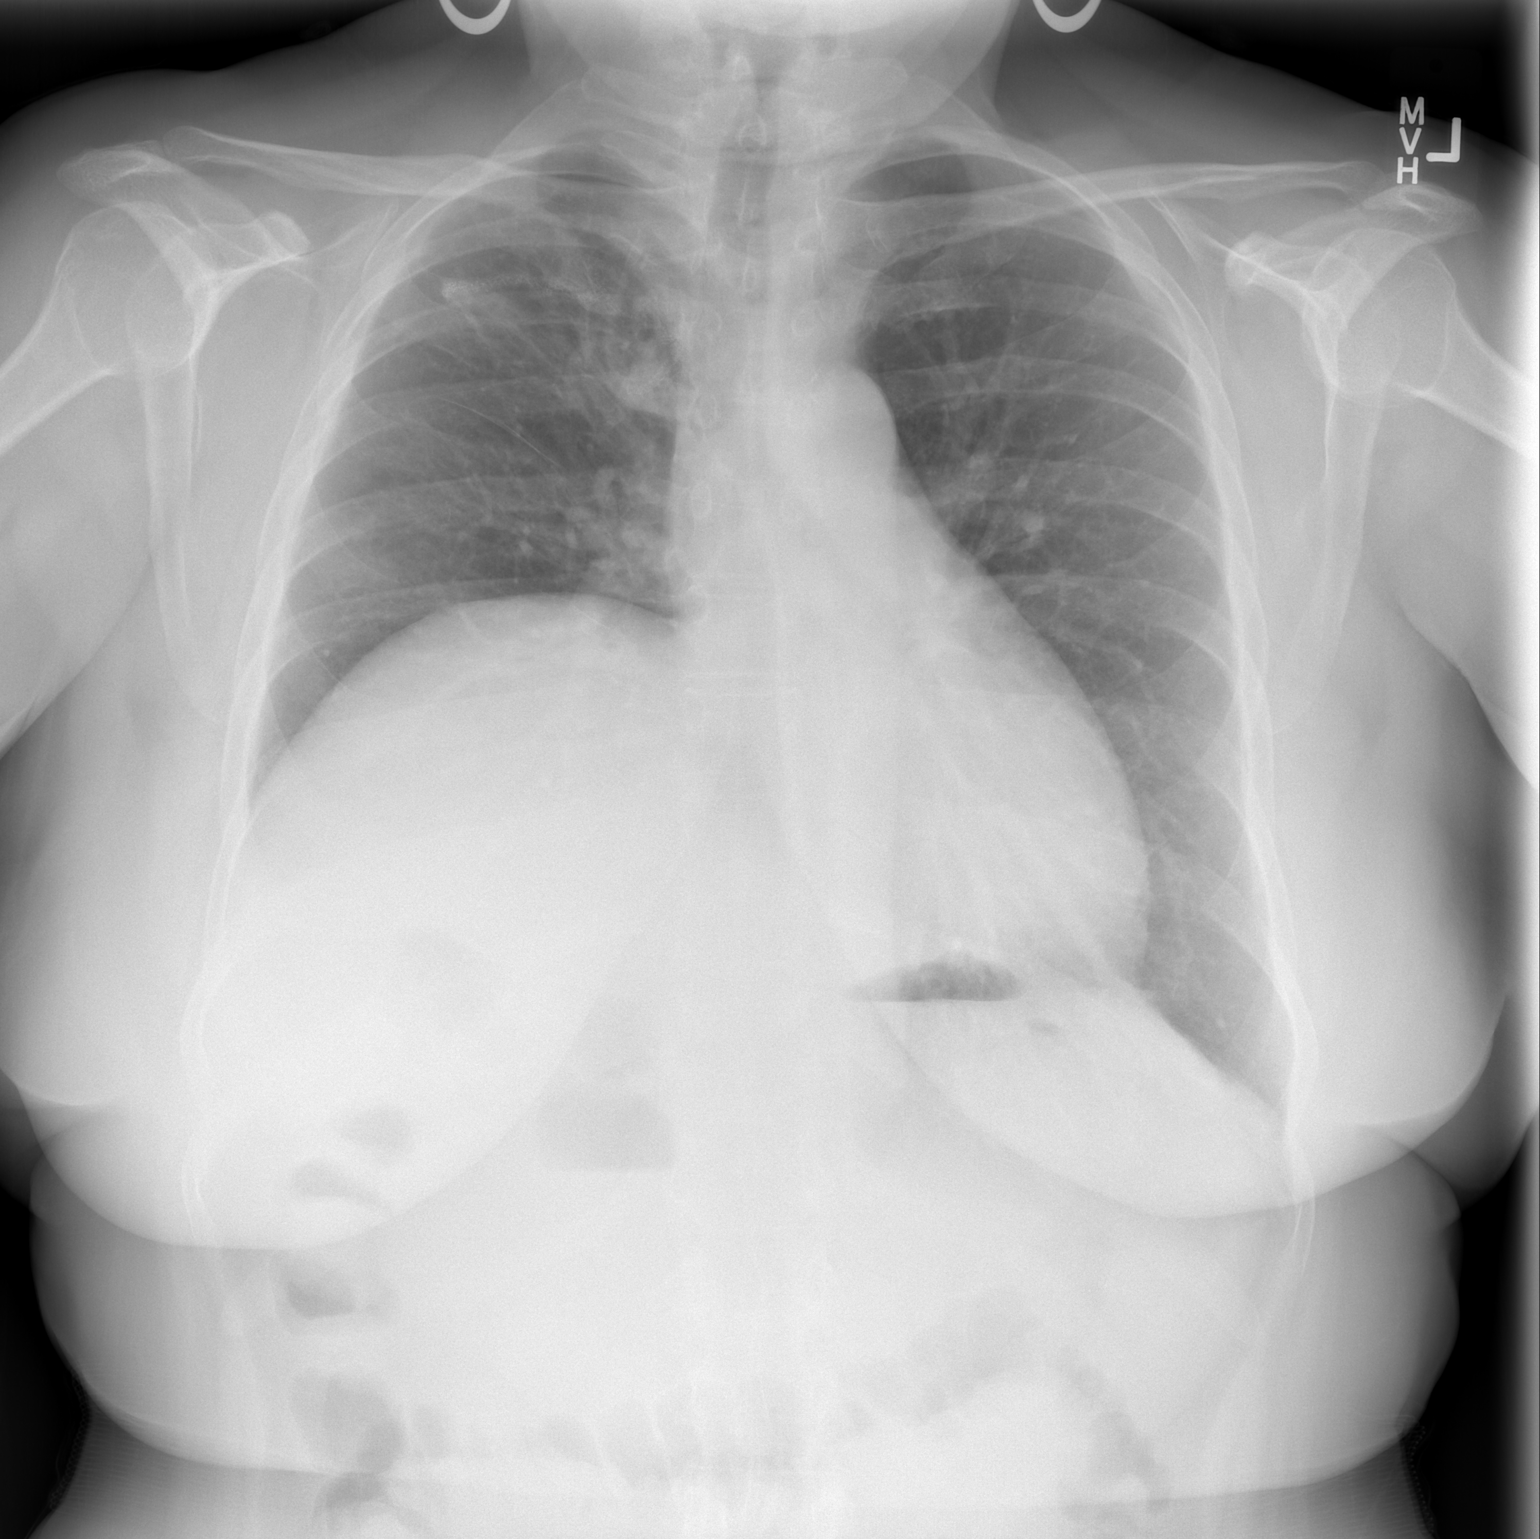

[1 of 1 positions shown; findings below may reference images not displayed]

FINDINGS: Continued RIGHT hemidiaphragm elevation. Normal heart size. Surgical
staples RIGHT upper lobe, with scarring. No pneumothorax. No
effusion. LEFT lung clear. No osseous findings.
IMPRESSION: RIGHT pneumothorax has resolved.  No acute disease.

## 2019-11-24 DIAGNOSIS — G4733 Obstructive sleep apnea (adult) (pediatric): Secondary | ICD-10-CM | POA: Diagnosis not present

## 2019-12-04 DIAGNOSIS — K449 Diaphragmatic hernia without obstruction or gangrene: Secondary | ICD-10-CM | POA: Diagnosis not present

## 2019-12-04 DIAGNOSIS — J984 Other disorders of lung: Secondary | ICD-10-CM | POA: Diagnosis not present

## 2019-12-04 DIAGNOSIS — R918 Other nonspecific abnormal finding of lung field: Secondary | ICD-10-CM | POA: Diagnosis not present

## 2019-12-04 DIAGNOSIS — R079 Chest pain, unspecified: Secondary | ICD-10-CM | POA: Diagnosis not present

## 2019-12-04 DIAGNOSIS — Z85118 Personal history of other malignant neoplasm of bronchus and lung: Secondary | ICD-10-CM | POA: Diagnosis not present

## 2019-12-04 DIAGNOSIS — J700 Acute pulmonary manifestations due to radiation: Secondary | ICD-10-CM | POA: Diagnosis not present

## 2019-12-05 DIAGNOSIS — R079 Chest pain, unspecified: Secondary | ICD-10-CM | POA: Diagnosis not present

## 2019-12-14 DIAGNOSIS — L255 Unspecified contact dermatitis due to plants, except food: Secondary | ICD-10-CM | POA: Diagnosis not present

## 2019-12-14 DIAGNOSIS — L299 Pruritus, unspecified: Secondary | ICD-10-CM | POA: Diagnosis not present

## 2019-12-20 DIAGNOSIS — R928 Other abnormal and inconclusive findings on diagnostic imaging of breast: Secondary | ICD-10-CM | POA: Diagnosis not present

## 2019-12-20 DIAGNOSIS — N644 Mastodynia: Secondary | ICD-10-CM | POA: Diagnosis not present

## 2019-12-24 DIAGNOSIS — G4733 Obstructive sleep apnea (adult) (pediatric): Secondary | ICD-10-CM | POA: Diagnosis not present

## 2020-01-04 DIAGNOSIS — N6342 Unspecified lump in left breast, subareolar: Secondary | ICD-10-CM | POA: Diagnosis not present

## 2020-01-04 DIAGNOSIS — R928 Other abnormal and inconclusive findings on diagnostic imaging of breast: Secondary | ICD-10-CM | POA: Diagnosis not present

## 2020-01-04 DIAGNOSIS — D242 Benign neoplasm of left breast: Secondary | ICD-10-CM | POA: Diagnosis not present

## 2020-01-04 DIAGNOSIS — N6489 Other specified disorders of breast: Secondary | ICD-10-CM | POA: Diagnosis not present

## 2020-01-06 DIAGNOSIS — C3491 Malignant neoplasm of unspecified part of right bronchus or lung: Secondary | ICD-10-CM | POA: Diagnosis not present

## 2020-01-06 DIAGNOSIS — Z902 Acquired absence of lung [part of]: Secondary | ICD-10-CM | POA: Diagnosis not present

## 2020-01-06 DIAGNOSIS — Z85118 Personal history of other malignant neoplasm of bronchus and lung: Secondary | ICD-10-CM | POA: Diagnosis not present

## 2020-01-06 DIAGNOSIS — C3412 Malignant neoplasm of upper lobe, left bronchus or lung: Secondary | ICD-10-CM | POA: Diagnosis not present

## 2020-01-06 DIAGNOSIS — R0789 Other chest pain: Secondary | ICD-10-CM | POA: Diagnosis not present

## 2020-01-06 DIAGNOSIS — Z923 Personal history of irradiation: Secondary | ICD-10-CM | POA: Diagnosis not present

## 2020-01-24 DIAGNOSIS — G4733 Obstructive sleep apnea (adult) (pediatric): Secondary | ICD-10-CM | POA: Diagnosis not present

## 2020-03-15 DIAGNOSIS — E1165 Type 2 diabetes mellitus with hyperglycemia: Secondary | ICD-10-CM | POA: Diagnosis not present

## 2020-03-15 DIAGNOSIS — F331 Major depressive disorder, recurrent, moderate: Secondary | ICD-10-CM | POA: Diagnosis not present

## 2020-03-15 DIAGNOSIS — G4700 Insomnia, unspecified: Secondary | ICD-10-CM | POA: Diagnosis not present

## 2020-03-15 DIAGNOSIS — F411 Generalized anxiety disorder: Secondary | ICD-10-CM | POA: Diagnosis not present

## 2020-03-19 DIAGNOSIS — E1165 Type 2 diabetes mellitus with hyperglycemia: Secondary | ICD-10-CM | POA: Diagnosis not present

## 2020-03-19 DIAGNOSIS — Z23 Encounter for immunization: Secondary | ICD-10-CM | POA: Diagnosis not present

## 2020-04-10 DIAGNOSIS — E1165 Type 2 diabetes mellitus with hyperglycemia: Secondary | ICD-10-CM | POA: Diagnosis not present

## 2020-04-10 DIAGNOSIS — E11649 Type 2 diabetes mellitus with hypoglycemia without coma: Secondary | ICD-10-CM | POA: Diagnosis not present

## 2020-04-12 DIAGNOSIS — C3411 Malignant neoplasm of upper lobe, right bronchus or lung: Secondary | ICD-10-CM | POA: Diagnosis not present

## 2020-04-12 DIAGNOSIS — C3412 Malignant neoplasm of upper lobe, left bronchus or lung: Secondary | ICD-10-CM | POA: Diagnosis not present

## 2020-04-12 DIAGNOSIS — E041 Nontoxic single thyroid nodule: Secondary | ICD-10-CM | POA: Diagnosis not present

## 2020-04-24 DIAGNOSIS — Z01419 Encounter for gynecological examination (general) (routine) without abnormal findings: Secondary | ICD-10-CM | POA: Diagnosis not present

## 2020-04-24 DIAGNOSIS — Z1151 Encounter for screening for human papillomavirus (HPV): Secondary | ICD-10-CM | POA: Diagnosis not present

## 2020-05-06 DIAGNOSIS — G4733 Obstructive sleep apnea (adult) (pediatric): Secondary | ICD-10-CM | POA: Diagnosis not present

## 2020-05-10 DIAGNOSIS — E119 Type 2 diabetes mellitus without complications: Secondary | ICD-10-CM | POA: Diagnosis not present

## 2020-05-28 DIAGNOSIS — I10 Essential (primary) hypertension: Secondary | ICD-10-CM | POA: Diagnosis not present

## 2020-05-28 DIAGNOSIS — E1165 Type 2 diabetes mellitus with hyperglycemia: Secondary | ICD-10-CM | POA: Diagnosis not present

## 2020-05-28 DIAGNOSIS — E782 Mixed hyperlipidemia: Secondary | ICD-10-CM | POA: Diagnosis not present

## 2020-05-28 DIAGNOSIS — I152 Hypertension secondary to endocrine disorders: Secondary | ICD-10-CM | POA: Diagnosis not present

## 2020-08-06 DIAGNOSIS — G4733 Obstructive sleep apnea (adult) (pediatric): Secondary | ICD-10-CM | POA: Diagnosis not present

## 2020-09-09 DIAGNOSIS — Z20822 Contact with and (suspected) exposure to covid-19: Secondary | ICD-10-CM | POA: Diagnosis not present

## 2020-09-09 DIAGNOSIS — J029 Acute pharyngitis, unspecified: Secondary | ICD-10-CM | POA: Diagnosis not present

## 2020-09-09 DIAGNOSIS — R059 Cough, unspecified: Secondary | ICD-10-CM | POA: Diagnosis not present

## 2020-09-09 DIAGNOSIS — J019 Acute sinusitis, unspecified: Secondary | ICD-10-CM | POA: Diagnosis not present

## 2020-10-04 DIAGNOSIS — E1165 Type 2 diabetes mellitus with hyperglycemia: Secondary | ICD-10-CM | POA: Diagnosis not present

## 2020-10-04 DIAGNOSIS — I152 Hypertension secondary to endocrine disorders: Secondary | ICD-10-CM | POA: Diagnosis not present

## 2020-10-04 DIAGNOSIS — E782 Mixed hyperlipidemia: Secondary | ICD-10-CM | POA: Diagnosis not present

## 2020-10-04 DIAGNOSIS — I10 Essential (primary) hypertension: Secondary | ICD-10-CM | POA: Diagnosis not present

## 2020-10-10 DIAGNOSIS — C3412 Malignant neoplasm of upper lobe, left bronchus or lung: Secondary | ICD-10-CM | POA: Diagnosis not present

## 2020-10-10 DIAGNOSIS — Z08 Encounter for follow-up examination after completed treatment for malignant neoplasm: Secondary | ICD-10-CM | POA: Diagnosis not present

## 2020-10-10 DIAGNOSIS — Z85118 Personal history of other malignant neoplasm of bronchus and lung: Secondary | ICD-10-CM | POA: Diagnosis not present

## 2020-10-10 DIAGNOSIS — C3491 Malignant neoplasm of unspecified part of right bronchus or lung: Secondary | ICD-10-CM | POA: Diagnosis not present

## 2020-10-10 DIAGNOSIS — R911 Solitary pulmonary nodule: Secondary | ICD-10-CM | POA: Diagnosis not present

## 2020-12-03 DIAGNOSIS — R7301 Impaired fasting glucose: Secondary | ICD-10-CM | POA: Diagnosis not present

## 2020-12-03 DIAGNOSIS — E785 Hyperlipidemia, unspecified: Secondary | ICD-10-CM | POA: Diagnosis not present

## 2020-12-03 DIAGNOSIS — Z0000 Encounter for general adult medical examination without abnormal findings: Secondary | ICD-10-CM | POA: Diagnosis not present

## 2020-12-03 DIAGNOSIS — J449 Chronic obstructive pulmonary disease, unspecified: Secondary | ICD-10-CM | POA: Diagnosis not present

## 2020-12-03 DIAGNOSIS — R748 Abnormal levels of other serum enzymes: Secondary | ICD-10-CM | POA: Diagnosis not present

## 2020-12-03 DIAGNOSIS — I10 Essential (primary) hypertension: Secondary | ICD-10-CM | POA: Diagnosis not present

## 2020-12-25 DIAGNOSIS — Z1231 Encounter for screening mammogram for malignant neoplasm of breast: Secondary | ICD-10-CM | POA: Diagnosis not present

## 2020-12-25 DIAGNOSIS — E041 Nontoxic single thyroid nodule: Secondary | ICD-10-CM | POA: Diagnosis not present

## 2020-12-25 DIAGNOSIS — Z1239 Encounter for other screening for malignant neoplasm of breast: Secondary | ICD-10-CM | POA: Diagnosis not present

## 2020-12-25 DIAGNOSIS — M8588 Other specified disorders of bone density and structure, other site: Secondary | ICD-10-CM | POA: Diagnosis not present

## 2020-12-25 DIAGNOSIS — Z13820 Encounter for screening for osteoporosis: Secondary | ICD-10-CM | POA: Diagnosis not present

## 2021-01-22 DIAGNOSIS — M25551 Pain in right hip: Secondary | ICD-10-CM | POA: Diagnosis not present

## 2021-01-22 DIAGNOSIS — M545 Low back pain, unspecified: Secondary | ICD-10-CM | POA: Diagnosis not present

## 2021-01-22 DIAGNOSIS — M549 Dorsalgia, unspecified: Secondary | ICD-10-CM | POA: Diagnosis not present

## 2021-01-22 DIAGNOSIS — M25552 Pain in left hip: Secondary | ICD-10-CM | POA: Diagnosis not present

## 2021-01-22 DIAGNOSIS — G8929 Other chronic pain: Secondary | ICD-10-CM | POA: Diagnosis not present

## 2021-01-22 DIAGNOSIS — G4700 Insomnia, unspecified: Secondary | ICD-10-CM | POA: Diagnosis not present

## 2021-01-22 DIAGNOSIS — M818 Other osteoporosis without current pathological fracture: Secondary | ICD-10-CM | POA: Diagnosis not present

## 2023-02-24 ENCOUNTER — Encounter: Payer: Self-pay | Admitting: Neurology

## 2023-03-04 NOTE — Progress Notes (Deleted)
Assessment/Plan:   *** Subjective:   Regina Black was seen today in neurologic consultation at the request of [name]., MD.  The consultation is for the evaluation of hemifacial spasm.  Patient currently under the care of [facility] neurology in [facility]  Medical records made available to me are reviewed.  She has been seeing them for quite some time, but mostly in regards to back pain.  The facial spasms began in the middle of 2023, but was not really noted in the medical records until December, 2023.  Medical records dated at that point in time noted that patient was complaining about right eye and cheek twitching and mouth pulling.  On examination that day it stated twitching of the right eye and the right eye was smaller than the left.  This was prior to any Botox injections.  She was given a diagnosis of blepharospasm, started on tizanidine and recommended to start Botox.  Her first injections of Xeomin were May 27, 2022.  Her second injections of Xeomin were on August 27, 2022.  Noted that she was given injections around the mouth.  She called her primary care and asked for a referral for another opinion on September 16, but ultimately canceled that after asking for an opinion from neurosurgery in [place].   Patient had a consult with neurosurgery in [place] on October 3.  The patient is a 67 y.o. year old female with a history of ***.  The first symptom began *** years ago.   Neuroimaging has *** previously been performed.  It *** available for my review today.  PREVIOUS MEDICATIONS: ***  ALLERGIES:   Allergies  Allergen Reactions   Other Other (See Comments)    Pt was young adult, had tonsils removed - pt hemorrhaged after the procedure    Butorphanol Other (See Comments)    Syncope UNABLE TO MOVE LIMBS   Codeine Nausea And Vomiting    CURRENT MEDICATIONS:  Outpatient Encounter Medications as of 03/13/2023  Medication Sig   albuterol (PROVENTIL  HFA;VENTOLIN HFA) 108 (90 Base) MCG/ACT inhaler Inhale 2 puffs into the lungs every 6 (six) hours as needed for wheezing or shortness of breath.   DULoxetine (CYMBALTA) 60 MG capsule Take 1 capsule (60 mg total) by mouth daily. Needs office visit prior to anymore refills   DULoxetine (CYMBALTA) 60 MG capsule TAKE 1 CAPSULE BY MOUTH ONCE DAILY**NEEDS OFFICE VISIT FOR REFILLS**   fluticasone (FLONASE) 50 MCG/ACT nasal spray Place 2 sprays into both nostrils daily as needed (allergies/congestion).   hydrochlorothiazide (HYDRODIURIL) 25 MG tablet Take 25 mg by mouth daily.   lisinopril (PRINIVIL,ZESTRIL) 40 MG tablet Take 40 mg by mouth daily.   loratadine (CLARITIN) 10 MG tablet Take 10 mg by mouth daily.   metoprolol succinate (TOPROL-XL) 50 MG 24 hr tablet Take 1 tablet (50 mg total) by mouth daily. Take with or immediately following a meal.   Olopatadine HCl 0.2 % SOLN Apply 1 drop to eye daily. 1 drop each eye daily (Patient taking differently: Place 1 drop into both eyes daily as needed (eye allergies). 1 drop each eye daily)   omeprazole (PRILOSEC) 20 MG capsule Take 20 mg by mouth daily.    penciclovir (DENAVIR) 1 % cream Apply 1 application topically every 4 (four) hours as needed (applied to lip area for recurrent cold sores).    polyethylene glycol (MIRALAX / GLYCOLAX) packet Take 17 g daily by mouth.   valACYclovir (VALTREX) 1000 MG tablet Take  1 g by mouth 2 (two) times daily as needed. For fever blisters/cold sores.   No facility-administered encounter medications on file as of 03/13/2023.    Objective:   PHYSICAL EXAMINATION:    VITALS:  There were no vitals filed for this visit.  GEN:  Normal appears female in no acute distress.  Appears stated age. HEENT:  Normocephalic, atraumatic. The mucous membranes are moist. The superficial temporal arteries are without ropiness or tenderness. Cardiovascular: Regular rate and rhythm. Lungs: Clear to auscultation bilaterally. Neck/Heme:  There are no carotid bruits noted bilaterally.  NEUROLOGICAL: Orientation:  The patient is alert and oriented x 3.   Cranial nerves: There is good facial symmetry.  Extraocular muscles are intact and visual fields are full to confrontational testing. Speech is fluent and clear. Soft palate rises symmetrically and there is no tongue deviation. Hearing is intact to conversational tone. Tone: Tone is good throughout. Sensation: Sensation is intact to light touch and pinprick throughout (facial, trunk, extremities). Vibration is intact at the bilateral big toe. There is no extinction with double simultaneous stimulation. There is no sensory dermatomal level identified. Coordination:  The patient has no difficulty with RAM's or FNF bilaterally. Motor: Strength is 5/5 in the bilateral upper and lower extremities.  Shoulder shrug is equal and symmetric. There is no pronator drift.  There are no fasciculations noted. DTR's: Deep tendon reflexes are 2/4 at the bilateral biceps, triceps, brachioradialis, patella and achilles.  Plantar responses are downgoing bilaterally. Gait and Station: The patient is able to ambulate without difficulty. The patient is able to heel toe walk without any difficulty. The patient is able to ambulate in a tandem fashion. The patient is able to stand in the Romberg position.    Total time spent on today's visit was ***greater than 60 minutes, including both face-to-face time and nonface-to-face time.  Time included that spent on review of records (prior notes available to me/labs/imaging if pertinent), discussing treatment and goals, answering patient's questions and coordinating care.   Cc:  No primary care provider on file.

## 2023-03-13 ENCOUNTER — Ambulatory Visit: Payer: BLUE CROSS/BLUE SHIELD | Admitting: Neurology
